# Patient Record
Sex: Female | Born: 1970 | Hispanic: Yes | Marital: Single | Smoking: Never smoker
Health system: Southern US, Community
[De-identification: ages and names within clinical notes are randomized; demographics above are authoritative.]

## PROBLEM LIST (undated history)

## (undated) HISTORY — PX: EYE SURGERY: SHX253

---

## 2017-04-19 ENCOUNTER — Ambulatory Visit: Payer: Self-pay | Admitting: Medical

## 2017-04-27 ENCOUNTER — Ambulatory Visit: Payer: Self-pay | Admitting: Medical

## 2018-01-13 ENCOUNTER — Ambulatory Visit: Payer: Self-pay | Admitting: Medical

## 2018-01-13 ENCOUNTER — Encounter: Payer: Self-pay | Admitting: Medical

## 2018-01-13 ENCOUNTER — Ambulatory Visit (INDEPENDENT_AMBULATORY_CARE_PROVIDER_SITE_OTHER): Payer: BLUE CROSS/BLUE SHIELD | Admitting: Medical

## 2018-01-13 VITALS — BP 114/63 | HR 68 | Temp 98.6°F | Resp 16 | Ht 63.5 in | Wt 169.6 lb

## 2018-01-13 DIAGNOSIS — R5383 Other fatigue: Secondary | ICD-10-CM | POA: Diagnosis not present

## 2018-01-13 DIAGNOSIS — Z113 Encounter for screening for infections with a predominantly sexual mode of transmission: Secondary | ICD-10-CM

## 2018-01-13 DIAGNOSIS — Z3009 Encounter for other general counseling and advice on contraception: Secondary | ICD-10-CM

## 2018-01-13 DIAGNOSIS — N926 Irregular menstruation, unspecified: Secondary | ICD-10-CM | POA: Diagnosis not present

## 2018-01-13 DIAGNOSIS — Z124 Encounter for screening for malignant neoplasm of cervix: Secondary | ICD-10-CM

## 2018-01-13 DIAGNOSIS — Z1239 Encounter for other screening for malignant neoplasm of breast: Secondary | ICD-10-CM

## 2018-01-13 DIAGNOSIS — R1013 Epigastric pain: Secondary | ICD-10-CM

## 2018-01-13 DIAGNOSIS — R634 Abnormal weight loss: Secondary | ICD-10-CM | POA: Diagnosis not present

## 2018-01-13 DIAGNOSIS — R413 Other amnesia: Secondary | ICD-10-CM

## 2018-01-13 LAB — POCT URINE PREGNANCY: Preg Test, Ur: NEGATIVE

## 2018-01-13 MED ORDER — OMEPRAZOLE 20 MG PO CPDR: 20.0000 mg | DELAYED_RELEASE_CAPSULE | Freq: Every day | ORAL | 3 refills | Status: DC

## 2018-01-13 MED ORDER — ONDANSETRON 8 MG PO TBDP: 8.0000 mg | ORAL_TABLET | Freq: Three times a day (TID) | ORAL | 0 refills | Status: DC | PRN

## 2018-01-13 NOTE — Progress Notes (Signed)
Subjective:    Patient ID: Virginia Allen, female    DOB: 03/08/1971, 47 y.o.   MRN: [id number]  HPI  Pt in for first time.  She has been in with her son in past.  She is Runner, broadcasting/film/video (spanish).  Pt does work out dancing at time during the week. Nonsmoker. Occasional alcohol only on weekend twice a month or wine with dinner. She is eating healthy. Married- one child.  Pt declines flu vaccine.  Pt does not take any medication.   Pt states has some recent stomach pain. States Sunday when she ate she was feeling immediate abdomen pain and nausea. Then about one week ago had similar events. She states 2 events recently that had quick onset of pain after eating. States since Tuesday until today eating chicken soup. No vomiting. No diarrhea or constipation. No blood in stool. Denies any history of heart burn in the past.   She does not since summer has lost some weight. Last was about 20 lbs in past 3 and half months.  In the summer she was not having stomach.   LMP- last menses 20 days ago.   Just recent relationship. Pt did take morning after pill. She states may have effected.   Pt never had papsmear. That she can remember.   Review of Systems  Constitutional: Positive for fatigue and unexpected weight change.       She states last 2 weeks feels tired.  HENT: Positive for congestion. Negative for hearing loss, nosebleeds, postnasal drip and rhinorrhea.        Some tinnitus last 2 days.   Respiratory: Negative for cough, chest tightness, shortness of breath and wheezing.   Cardiovascular: Negative for chest pain and palpitations.  Gastrointestinal: Positive for abdominal pain. Negative for abdominal distention, blood in stool, constipation, diarrhea, nausea and vomiting.  Endocrine: Positive for polydipsia. Negative for polyphagia and polyuria.  Genitourinary: Negative for difficulty urinating, dysuria, frequency, pelvic pain, urgency, vaginal bleeding and vaginal pain.    Musculoskeletal: Negative for back pain and myalgias.  Skin: Negative for rash.       Hair loss  Neurological: Negative for dizziness, weakness and headaches.  Hematological: Negative for adenopathy. Does not bruise/bleed easily.  Psychiatric/Behavioral: Positive for sleep disturbance. Negative for agitation, confusion and dysphoric mood. The patient is not nervous/anxious.        Just recently not sleeping well.   Recent mild forgetfull and decreased concentration.  Some recent more stress.    No past medical history on file.   Social History   Socioeconomic History  . Marital status: Single    Spouse name: Not on file  . Number of children: Not on file  . Years of education: Not on file  . Highest education level: Not on file  Occupational History  . Not on file  Social Needs  . Financial resource strain: Not on file  . Food insecurity:    Worry: Not on file    Inability: Not on file  . Transportation needs:    Medical: Not on file    Non-medical: Not on file  Tobacco Use  . Smoking status: Never Smoker  . Smokeless tobacco: Never Used  Substance and Sexual Activity  . Alcohol use: Never    Frequency: Never  . Drug use: Never  . Sexual activity: Not on file  Lifestyle  . Physical activity:    Days per week: Not on file    Minutes per session: Not on file  .  Stress: Not on file  Relationships  . Social connections:    Talks on phone: Not on file    Gets together: Not on file    Attends religious service: Not on file    Active member of club or organization: Not on file    Attends meetings of clubs or organizations: Not on file    Relationship status: Not on file  . Intimate partner violence:    Fear of current or ex partner: Not on file    Emotionally abused: Not on file    Physically abused: Not on file    Forced sexual activity: Not on file  Other Topics Concern  . Not on file  Social History Narrative  . Not on file     No family history on  file.  Not on File  No current outpatient medications on file prior to visit.   No current facility-administered medications on file prior to visit.     BP 114/63   Pulse 68   Temp 98.6 F (37 C) (Oral)   Resp 16   Ht 5' 3.5" (1.613 m)   Wt 169 lb 9.6 oz (76.9 kg)   SpO2 99%   BMI 29.57 kg/m       Objective:   Physical Exam  General Mental Status- Alert. General Appearance- Not in acute distress.   Skin General: Color- Normal Color. Moisture- Normal Moisture.  Neck Carotid Arteries- Normal color. Moisture- Normal Moisture. No carotid bruits. No JVD.  Chest and Lung Exam Auscultation: Breath Sounds:-Normal.  Cardiovascular Auscultation:Rythm- Regular. Murmurs & Other Heart Sounds:Auscultation of the heart reveals- No Murmurs.  Abdomen Inspection:-Inspeection Normal. Palpation/Percussion:Note:No mass. Palpation and Percussion of the abdomen reveal- Non Tender, Non Distended + BS, no rebound or guarding.   Neurologic Cranial Nerve exam:- CN III-XII intact(No nystagmus), symmetric smile. Finger to Nose:- Normal/Intact Strength:- 5/5 equal and symmetric strength both upper and lower extremities.      Assessment & Plan:  For your recent history of fatigue, epigastric pain, weight loss and nausea, I ordered the below listed labs.  While he wait for lab results, I do want you to eat healthy diet and prescribed omeprazole for potential GERD and Zofran for nausea and vomiting.  For recent irregular menses and unprotected sex, we will order pregnancy test and HIV screen.  I went ahead and placed gynecologic referral so we can get screening Pap smear and discuss family planning/contraceptive options.  During the interim recommend use condoms.  You have intermittent episodes of decreased concentration and memory complaints.  Will follow your labs but if everything looks normal then we will do baseline Mini-Mental status test.  If that is normal but symptomatically  you feel that memory issues are worsening then would refer to neurologist.  Regarding your weight loss complaints, will follow this closely and make sure Pap smear up-to-date and order screening mammogram.  If still comes back positive for blood then would refer to GI for possible colonoscopy.  Might even consider EGD if your stomach pain complaints persist.  CT abdomen pelvis might be option as well.  Follow-up in 10 to 14 days or as needed.  45 minutes spent with pt today. Counseled 50% regarding differntial diagnosis and work up for [employer] complaints.

## 2018-01-13 NOTE — Patient Instructions (Addendum)
For your recent history of fatigue, epigastric pain, weight loss and nausea, I ordered the below listed labs.  While he wait for lab results, I do want you to eat healthy diet and prescribed omeprazole for potential GERD and Zofran for nausea and vomiting.  For recent irregular menses and unprotected sex, we will order pregnancy test and HIV screen.  I went ahead and placed gynecologic referral so we can get screening Pap smear and discuss family planning/contraceptive options.  During the interim recommend use condoms.  You have intermittent episodes of decreased concentration and memory complaints.  Will follow your labs but if everything looks normal then we will do baseline Mini-Mental status test.  If that is normal but symptomatically you feel that memory issues are worsening then would refer to neurologist.  Regarding your weight loss complaints, will follow this closely and make sure Pap smear up-to-date and order screening mammogram.  If still comes back positive for blood then would refer to GI for possible colonoscopy.  Might even consider EGD if your stomach pain complaints persist.  CT abdomen pelvis might be option as well.   Esperanza Richters, PA-C Follow-up in 10 to 14 days or as needed.

## 2018-01-14 LAB — CBC WITH DIFFERENTIAL/PLATELET
BASOS PCT: 0.6 %
Basophils Absolute: 56 cells/uL (ref 0–200)
EOS PCT: 1.1 %
Eosinophils Absolute: 102 cells/uL (ref 15–500)
HCT: 37.5 % (ref 35.0–45.0)
Hemoglobin: 12.3 g/dL (ref 11.7–15.5)
Lymphs Abs: 2585 cells/uL (ref 850–3900)
MCH: 27.7 pg (ref 27.0–33.0)
MCHC: 32.8 g/dL (ref 32.0–36.0)
MCV: 84.5 fL (ref 80.0–100.0)
MONOS PCT: 6.6 %
MPV: 11.5 fL (ref 7.5–12.5)
NEUTROS PCT: 63.9 %
Neutro Abs: 5943 cells/uL (ref 1500–7800)
PLATELETS: 325 10*3/uL (ref 140–400)
RBC: 4.44 10*6/uL (ref 3.80–5.10)
RDW: 12.7 % (ref 11.0–15.0)
TOTAL LYMPHOCYTE: 27.8 %
WBC mixed population: 614 cells/uL (ref 200–950)
WBC: 9.3 10*3/uL (ref 3.8–10.8)

## 2018-01-14 LAB — T4, FREE: FREE T4: 1.1 ng/dL (ref 0.8–1.8)

## 2018-01-14 LAB — VITAMIN D 25 HYDROXY (VIT D DEFICIENCY, FRACTURES): Vit D, 25-Hydroxy: 35 ng/mL (ref 30–100)

## 2018-01-14 LAB — COMPREHENSIVE METABOLIC PANEL
AG Ratio: 1.4 (calc) (ref 1.0–2.5)
ALKALINE PHOSPHATASE (APISO): 77 U/L (ref 33–115)
ALT: 10 U/L (ref 6–29)
AST: 12 U/L (ref 10–35)
Albumin: 4 g/dL (ref 3.6–5.1)
BUN: 9 mg/dL (ref 7–25)
CHLORIDE: 105 mmol/L (ref 98–110)
CO2: 24 mmol/L (ref 20–32)
CREATININE: 0.72 mg/dL (ref 0.50–1.10)
Calcium: 8.5 mg/dL — ABNORMAL LOW (ref 8.6–10.2)
GLUCOSE: 76 mg/dL (ref 65–99)
Globulin: 2.8 g/dL (calc) (ref 1.9–3.7)
Potassium: 3.9 mmol/L (ref 3.5–5.3)
Sodium: 140 mmol/L (ref 135–146)
Total Bilirubin: 0.3 mg/dL (ref 0.2–1.2)
Total Protein: 6.8 g/dL (ref 6.1–8.1)

## 2018-01-14 LAB — TSH: TSH: 1.63 m[IU]/L

## 2018-01-14 LAB — IRON: IRON: 45 ug/dL (ref 40–190)

## 2018-01-14 LAB — AMYLASE: AMYLASE: 32 U/L (ref 21–101)

## 2018-01-14 LAB — VITAMIN B12: Vitamin B-12: 466 pg/mL (ref 200–1100)

## 2018-01-14 LAB — LIPASE: LIPASE: 16 U/L (ref 7–60)

## 2018-01-14 LAB — HIV ANTIBODY (ROUTINE TESTING W REFLEX): HIV: NONREACTIVE

## 2018-01-16 ENCOUNTER — Telehealth: Payer: Self-pay | Admitting: Medical

## 2018-01-16 LAB — H. PYLORI BREATH TEST: H. pylori Breath Test: DETECTED — AB

## 2018-01-16 MED ORDER — CLARITHROMYCIN ER 500 MG PO TB24: 1000.0000 mg | ORAL_TABLET | Freq: Every day | ORAL | 0 refills | Status: DC

## 2018-01-16 MED ORDER — AMOXICILLIN 500 MG PO TABS: ORAL_TABLET | ORAL | 0 refills | Status: DC

## 2018-01-16 NOTE — Telephone Encounter (Signed)
rx antibiotic sent to pt pharmacy.

## 2018-01-17 ENCOUNTER — Inpatient Hospital Stay (HOSPITAL_BASED_OUTPATIENT_CLINIC_OR_DEPARTMENT_OTHER): Admission: RE | Admit: 2018-01-17 | Payer: BLUE CROSS/BLUE SHIELD | Source: Ambulatory Visit

## 2018-01-17 ENCOUNTER — Ambulatory Visit (HOSPITAL_BASED_OUTPATIENT_CLINIC_OR_DEPARTMENT_OTHER): Payer: BLUE CROSS/BLUE SHIELD

## 2018-01-17 LAB — VITAMIN B1: Vitamin B1 (Thiamine): <6 nmol/L — ABNORMAL LOW (ref 8–30)

## 2018-01-18 ENCOUNTER — Ambulatory Visit (HOSPITAL_BASED_OUTPATIENT_CLINIC_OR_DEPARTMENT_OTHER)
Admission: RE | Admit: 2018-01-18 | Discharge: 2018-01-18 | Disposition: A | Discharge status: Home / Self Care | Payer: BLUE CROSS/BLUE SHIELD | Source: Ambulatory Visit | Attending: Medical | Admitting: Medical

## 2018-01-18 DIAGNOSIS — Z1239 Encounter for other screening for malignant neoplasm of breast: Secondary | ICD-10-CM | POA: Insufficient documentation

## 2018-01-18 DIAGNOSIS — Z1231 Encounter for screening mammogram for malignant neoplasm of breast: Secondary | ICD-10-CM | POA: Diagnosis not present

## 2018-02-02 ENCOUNTER — Encounter: Payer: Self-pay | Admitting: Family Medicine

## 2018-02-02 ENCOUNTER — Ambulatory Visit (INDEPENDENT_AMBULATORY_CARE_PROVIDER_SITE_OTHER): Payer: BLUE CROSS/BLUE SHIELD | Admitting: Family Medicine

## 2018-02-02 VITALS — BP 110/55 | HR 73 | Ht 63.5 in | Wt 167.0 lb

## 2018-02-02 DIAGNOSIS — Z1151 Encounter for screening for human papillomavirus (HPV): Secondary | ICD-10-CM

## 2018-02-02 DIAGNOSIS — Z01419 Encounter for gynecological examination (general) (routine) without abnormal findings: Secondary | ICD-10-CM

## 2018-02-02 DIAGNOSIS — Z113 Encounter for screening for infections with a predominantly sexual mode of transmission: Secondary | ICD-10-CM

## 2018-02-02 DIAGNOSIS — Z124 Encounter for screening for malignant neoplasm of cervix: Secondary | ICD-10-CM

## 2018-02-02 NOTE — Progress Notes (Signed)
GYNECOLOGY ANNUAL PREVENTATIVE CARE ENCOUNTER NOTE  Subjective:   Virginia Allen is a 48 y.o. G59P1001 female here for a routine annual gynecologic exam.  Current complaints: weight loss, unprotected sex.   Denies abnormal vaginal bleeding, discharge, pelvic pain, problems with intercourse or other gynecologic concerns.    Gynecologic History No LMP recorded. Patient is sexually active  Contraception: none Last Pap: uncertain. Mammogram: 12/2017. Normal.  Obstetric History OB History  Gravida Para Term Preterm AB Living  1 1 1     1   SAB TAB Ectopic Multiple Live Births          1    # Outcome Date GA Lbr Len/2nd Weight Sex Delivery Anes PTL Lv  1 Term 2002 [redacted]w[redacted]d   M Vag-Spont EPI  LIV    No past medical history on file.  No past surgical history on file.  Current Outpatient Medications on File Prior to Visit  Medication Sig Dispense Refill  . amoxicillin (AMOXIL) 500 MG tablet 2 tab po bid x 10 days 40 tablet 0  . clarithromycin (BIAXIN XL) 500 MG 24 hr tablet Take 2 tablets (1,000 mg total) by mouth daily. 20 tablet 0  . omeprazole (PRILOSEC) 20 MG capsule Take 1 capsule (20 mg total) by mouth daily. 30 capsule 3  . ondansetron (ZOFRAN ODT) 8 MG disintegrating tablet Take 1 tablet (8 mg total) by mouth every 8 (eight) hours as needed for nausea or vomiting. (Patient not taking: Reported on 02/02/2018) 20 tablet 0   No current facility-administered medications on file prior to visit.     No Known Allergies  Social History   Socioeconomic History  . Marital status: Single    Spouse name: Not on file  . Number of children: Not on file  . Years of education: Not on file  . Highest education level: Not on file  Occupational History  . Not on file  Social Needs  . Financial resource strain: Not on file  . Food insecurity:    Worry: Not on file    Inability: Not on file  . Transportation needs:    Medical: Not on file    Non-medical: Not on file    Tobacco Use  . Smoking status: Never Smoker  . Smokeless tobacco: Never Used  Substance and Sexual Activity  . Alcohol use: Never    Frequency: Never    Comment: rare  . Drug use: Never  . Sexual activity: Yes    Birth control/protection: None  Lifestyle  . Physical activity:    Days per week: Not on file    Minutes per session: Not on file  . Stress: Not on file  Relationships  . Social connections:    Talks on phone: Not on file    Gets together: Not on file    Attends religious service: Not on file    Active member of club or organization: Not on file    Attends meetings of clubs or organizations: Not on file    Relationship status: Not on file  . Intimate partner violence:    Fear of current or ex partner: Not on file    Emotionally abused: Not on file    Physically abused: Not on file    Forced sexual activity: Not on file  Other Topics Concern  . Not on file  Social History Narrative  . Not on file    Family History  Problem Relation Age of Onset  . Hypertension Father   .  Cancer Father   . Hypertension Mother     The following portions of the patient's history were reviewed and updated as appropriate: allergies, current medications, past family history, past medical history, past social history, past surgical history and problem list.  Review of Systems Pertinent items are noted in HPI.   Objective:  BP (!) 110/55   Pulse 73   Ht 5' 3.5" (1.613 m)   Wt 167 lb 0.6 oz (75.8 kg)   BMI 29.13 kg/m  CONSTITUTIONAL: Well-developed, well-nourished female in no acute distress.  HENT:  Normocephalic, atraumatic, External right and left ear normal. Oropharynx is clear and moist EYES: Conjunctivae and EOM are normal. Pupils are equal, round, and reactive to light. No scleral icterus.  NECK: Normal range of motion, supple, no masses.  Normal thyroid.   CARDIOVASCULAR: Normal heart rate noted, regular rhythm RESPIRATORY: Clear to auscultation bilaterally. Effort  and breath sounds normal, no problems with respiration noted. BREASTS: Symmetric in size. No masses, skin changes, nipple drainage, or lymphadenopathy. ABDOMEN: Soft, normal bowel sounds, no distention noted.  No tenderness, rebound or guarding.  PELVIC: Normal appearing external genitalia; normal appearing vaginal mucosa and cervix.  No abnormal discharge noted.  Normal uterine size, no other palpable masses, no uterine or adnexal tenderness. MUSCULOSKELETAL: Normal range of motion. No tenderness.  No cyanosis, clubbing, or edema.  2+ distal pulses. SKIN: Skin is warm and dry. No rash noted. Not diaphoretic. No erythema. No pallor. NEUROLOGIC: Alert and oriented to person, place, and time. Normal reflexes, muscle tone coordination. No cranial nerve deficit noted. PSYCHIATRIC: Normal mood and affect. Normal behavior. Normal judgment and thought content.  Assessment:  Annual gynecologic examination with pap smear   Plan:  1. Well Woman Exam Will follow up results of pap smear and manage accordingly. Mammogram scheduled STD testing discussed. Patient requested vaginal testing - Cytology - PAP  Discussed IUD vs Nexplanon vs oral medication.  Routine preventative health maintenance measures emphasized. Please refer to After Visit Summary for other counseling recommendations.    Candelaria CelesteJacob Latrell Reitan, DO [facility]

## 2018-02-02 NOTE — Progress Notes (Signed)
[employer] interpreter Daysha [id number] 

## 2018-02-07 LAB — CYTOLOGY - PAP
Chlamydia: NEGATIVE
Diagnosis: NEGATIVE
HPV: NOT DETECTED
Neisseria Gonorrhea: NEGATIVE

## 2018-02-08 ENCOUNTER — Ambulatory Visit: Payer: BLUE CROSS/BLUE SHIELD | Admitting: Medical

## 2018-02-08 ENCOUNTER — Encounter: Payer: Self-pay | Admitting: Medical

## 2018-02-08 VITALS — BP 105/56 | HR 71 | Temp 98.1°F | Resp 16 | Ht 63.5 in | Wt 169.2 lb

## 2018-02-08 DIAGNOSIS — R7989 Other specified abnormal findings of blood chemistry: Secondary | ICD-10-CM | POA: Diagnosis not present

## 2018-02-08 DIAGNOSIS — F329 Major depressive disorder, single episode, unspecified: Secondary | ICD-10-CM | POA: Diagnosis not present

## 2018-02-08 DIAGNOSIS — F32A Depression, unspecified: Secondary | ICD-10-CM

## 2018-02-08 DIAGNOSIS — A048 Other specified bacterial intestinal infections: Secondary | ICD-10-CM

## 2018-02-08 DIAGNOSIS — R5383 Other fatigue: Secondary | ICD-10-CM | POA: Diagnosis not present

## 2018-02-08 MED ORDER — VENLAFAXINE HCL ER 37.5 MG PO CP24: 37.5000 mg | ORAL_CAPSULE | Freq: Every day | ORAL | 0 refills | Status: DC

## 2018-02-08 NOTE — Progress Notes (Signed)
Subjective:    Patient ID: Virginia Allen, female    DOB: Aug 14, 1970, 47 y.o.   MRN: [id number]  HPI   Pt in for follow up.  Pt has had various symptoms in recent  Past. See last visit note.  Pt has had fatigue in past with work up. B1 vitamin level was low and calcium was low. Other labs negative except h pylori was +.  Pt states recent new  relationship ended. Pt also just found out her dad has bladder cancer. Some spreading to lungs of dad. Pt just found out 5 days ago. Pt states no hx of depression except in the beginning of her pregnancy. She describes feeling depressed and willing to use medication.   Pt weight has stablized. Pt max weight in past was 176 lb. Since summer mild weight loss but now weight loss stablized. Her tsh was normal. Amylase and  lipase normal. Pt papsmear was negative one week ago. Result was negative. She may get iud tomorrow.   Pt had h pylori was treated. She took 2 antibiotics. Also on omeprazole. Still has some pain. Brief bloated sensation today.    Review of Systems  Constitutional: Positive for fatigue. Negative for chills.  Respiratory: Negative for cough, chest tightness, shortness of breath and wheezing.   Cardiovascular: Negative for chest pain and palpitations.  Gastrointestinal: Positive for abdominal pain. Negative for abdominal distention, constipation, diarrhea, nausea and vomiting.       Full easily and bloated.  Genitourinary: Negative for difficulty urinating, dyspareunia and dysuria.  Musculoskeletal: Negative for back pain and gait problem.  Skin: Negative for rash.  Neurological: Negative for dizziness, speech difficulty, weakness and headaches.  Hematological: Negative for adenopathy. Does not bruise/bleed easily.  Psychiatric/Behavioral: Positive for dysphoric mood. Negative for behavioral problems, confusion, sleep disturbance and suicidal ideas. The patient is nervous/anxious.     No past medical history on file.     Social History   Socioeconomic History  . Marital status: Single    Spouse name: Not on file  . Number of children: Not on file  . Years of education: Not on file  . Highest education level: Not on file  Occupational History  . Not on file  Social Needs  . Financial resource strain: Not on file  . Food insecurity:    Worry: Not on file    Inability: Not on file  . Transportation needs:    Medical: Not on file    Non-medical: Not on file  Tobacco Use  . Smoking status: Never Smoker  . Smokeless tobacco: Never Used  Substance and Sexual Activity  . Alcohol use: Never    Frequency: Never    Comment: rare  . Drug use: Never  . Sexual activity: Yes    Birth control/protection: None  Lifestyle  . Physical activity:    Days per week: Not on file    Minutes per session: Not on file  . Stress: Not on file  Relationships  . Social connections:    Talks on phone: Not on file    Gets together: Not on file    Attends religious service: Not on file    Active member of club or organization: Not on file    Attends meetings of clubs or organizations: Not on file    Relationship status: Not on file  . Intimate partner violence:    Fear of current or ex partner: Not on file    Emotionally abused: Not on file  Physically abused: Not on file    Forced sexual activity: Not on file  Other Topics Concern  . Not on file  Social History Narrative  . Not on file    No past surgical history on file.  Family History  Problem Relation Age of Onset  . Hypertension Father   . Cancer Father   . Hypertension Mother     No Known Allergies  Current Outpatient Medications on File Prior to Visit  Medication Sig Dispense Refill  . clarithromycin (BIAXIN XL) 500 MG 24 hr tablet Take 2 tablets (1,000 mg total) by mouth daily. 20 tablet 0  . omeprazole (PRILOSEC) 20 MG capsule Take 1 capsule (20 mg total) by mouth daily. 30 capsule 3  . ondansetron (ZOFRAN ODT) 8 MG disintegrating tablet  Take 1 tablet (8 mg total) by mouth every 8 (eight) hours as needed for nausea or vomiting. 20 tablet 0   No current facility-administered medications on file prior to visit.     BP (!) 105/56   Pulse 71   Temp 98.1 F (36.7 C) (Oral)   Resp 16   Ht 5' 3.5" (1.613 m)   Wt 169 lb 3.2 oz (76.7 kg)   SpO2 100%   BMI 29.50 kg/m       Objective:   Physical Exam  General Mental Status- Alert. General Appearance- Not in acute distress.   Skin General: Color- Normal Color. Moisture- Normal Moisture.  Neck Carotid Arteries- Normal color. Moisture- Normal Moisture. No carotid bruits. No JVD.  Chest and Lung Exam Auscultation: Breath Sounds:-Normal.  Cardiovascular Auscultation:Rythm- Regular. Murmurs & Other Heart Sounds:Auscultation of the heart reveals- No Murmurs.  Abdomen Inspection:-Inspeection Normal. Palpation/Percussion:Note:No mass. Palpation and Percussion of the abdomen reveal- Non Tender, Non Distended + BS, no rebound or guarding.    Neurologic Cranial Nerve exam:- CN III-XII intact(No nystagmus), symmetric smile. Strength:- 5/5 equal and symmetric strength both upper and lower extremities.      Assessment & Plan:  Your fatigue work-up was negative except your B1 vitamin was low.  This might be a factor in your fatigue.  Please get B1 vitamin over-the-counter and take daily.  You did have some mildly low calcium on last labs and will repeat metabolic panel and add PTH level.  In addition you still have some abdominal discomfort and this causes concern that she might have resistant Helicobacter pylori despite being treated with antibiotics.  Will get H. pylori breath test tomorrow.  The fact that you have been on omeprazole might affect the accuracy of the test but if it is positive that will indicate persisting infection.  You might need referral to GI MD in the near future.  Your weight loss has been stabilized recently.  Initial work-up for weight loss  was negative.  As long as your weight is stable I do not think you need expansive work-up at this point.  For decreased mood recently related to various life circumstances including your dad's diagnosis of cancer, I will prescribe venlafaxine.  This might help with your energy as well.  Follow-up in 2 weeks or as needed.  Esperanza RichtersEdward Nickolus Wadding, PA-C

## 2018-02-08 NOTE — Patient Instructions (Signed)
Your fatigue work-up was negative except your B1 vitamin was low.  This might be a factor in your fatigue.  Please get B1 vitamin over-the-counter and take daily.  You did have some mildly low calcium on last labs and will repeat metabolic panel and add PTH level.  In addition you still have some abdominal discomfort and this causes concern that she might have resistant Helicobacter pylori despite being treated with antibiotics.  Will get H. pylori breath test tomorrow.  The fact that you have been on omeprazole might affect the accuracy of the test but if it is positive that will indicate persisting infection.  You might need referral to GI MD in the near future.  Your weight loss has been stabilized recently.  Initial work-up for weight loss was negative.  As long as your weight is stable I do not think you need expansive work-up at this point.  For decreased mood recently related to various life circumstances including your dad's diagnosis of cancer, I will prescribe venlafaxine.  This might help with your energy as well.  Follow-up in 2 weeks or as needed.

## 2018-02-09 ENCOUNTER — Other Ambulatory Visit (INDEPENDENT_AMBULATORY_CARE_PROVIDER_SITE_OTHER): Payer: BLUE CROSS/BLUE SHIELD

## 2018-02-09 ENCOUNTER — Ambulatory Visit (INDEPENDENT_AMBULATORY_CARE_PROVIDER_SITE_OTHER): Payer: BLUE CROSS/BLUE SHIELD | Admitting: Family Medicine

## 2018-02-09 VITALS — BP 118/79 | HR 75 | Ht 63.0 in | Wt 167.0 lb

## 2018-02-09 DIAGNOSIS — R7989 Other specified abnormal findings of blood chemistry: Secondary | ICD-10-CM | POA: Diagnosis not present

## 2018-02-09 DIAGNOSIS — A048 Other specified bacterial intestinal infections: Secondary | ICD-10-CM

## 2018-02-09 DIAGNOSIS — Z30432 Encounter for removal of intrauterine contraceptive device: Secondary | ICD-10-CM | POA: Diagnosis not present

## 2018-02-09 DIAGNOSIS — R5383 Other fatigue: Secondary | ICD-10-CM | POA: Diagnosis not present

## 2018-02-09 DIAGNOSIS — Z309 Encounter for contraceptive management, unspecified: Secondary | ICD-10-CM

## 2018-02-09 MED ORDER — MISOPROSTOL 200 MCG PO TABS: ORAL_TABLET | ORAL | 0 refills | Status: DC

## 2018-02-09 NOTE — Progress Notes (Signed)
IUD Procedure Note Patient identified, informed consent performed, signed copy in chart, time out was performed.  Urine pregnancy test negative.  Speculum placed in the vagina.  Cervix visualized.  Cleaned with Betadine x 2.  Grasped anteriorly with a single tooth tenaculum. Attempted to place IUD, but internal os stenotic. Attempted to dilate internal os, but unable to. Procedure stopped.   Will prescribe patient cytotec and have her place the cytotec vaginally night prior to next appointment.

## 2018-02-10 LAB — COMPREHENSIVE METABOLIC PANEL
ALT: 9 U/L (ref 0–35)
AST: 9 U/L (ref 0–37)
Albumin: 4 g/dL (ref 3.5–5.2)
Alkaline Phosphatase: 87 U/L (ref 39–117)
BUN: 15 mg/dL (ref 6–23)
CHLORIDE: 104 meq/L (ref 96–112)
CO2: 30 meq/L (ref 19–32)
CREATININE: 0.83 mg/dL (ref 0.40–1.20)
Calcium: 8.8 mg/dL (ref 8.4–10.5)
GFR: 78.07 mL/min (ref >60.00)
Glucose, Bld: 90 mg/dL (ref 70–99)
POTASSIUM: 4.3 meq/L (ref 3.5–5.1)
Sodium: 141 mEq/L (ref 135–145)
Total Bilirubin: 0.3 mg/dL (ref 0.2–1.2)
Total Protein: 6.9 g/dL (ref 6.0–8.3)

## 2018-02-10 LAB — H. PYLORI BREATH TEST: H. pylori Breath Test: NOT DETECTED

## 2018-02-10 LAB — PARATHYROID HORMONE, INTACT (NO CA): PTH: 69 pg/mL — ABNORMAL HIGH (ref 14–64)

## 2018-02-12 ENCOUNTER — Telehealth: Payer: Self-pay | Admitting: Medical

## 2018-02-12 DIAGNOSIS — R7989 Other specified abnormal findings of blood chemistry: Secondary | ICD-10-CM

## 2018-02-12 NOTE — Telephone Encounter (Signed)
Referral to endocrinologist placed. 

## 2018-02-22 ENCOUNTER — Ambulatory Visit: Payer: BLUE CROSS/BLUE SHIELD | Admitting: Medical

## 2018-02-23 ENCOUNTER — Encounter: Payer: Self-pay | Admitting: Family Medicine

## 2018-02-23 ENCOUNTER — Encounter: Payer: Self-pay | Admitting: Medical

## 2018-02-23 ENCOUNTER — Ambulatory Visit: Payer: BLUE CROSS/BLUE SHIELD | Admitting: Medical

## 2018-02-23 ENCOUNTER — Ambulatory Visit (INDEPENDENT_AMBULATORY_CARE_PROVIDER_SITE_OTHER): Payer: BLUE CROSS/BLUE SHIELD | Admitting: Family Medicine

## 2018-02-23 VITALS — BP 119/60 | HR 66 | Ht 63.0 in | Wt 170.0 lb

## 2018-02-23 DIAGNOSIS — Z30430 Encounter for insertion of intrauterine contraceptive device: Secondary | ICD-10-CM | POA: Diagnosis not present

## 2018-02-23 LAB — POCT URINE PREGNANCY: PREG TEST UR: NEGATIVE

## 2018-02-23 MED ORDER — LEVONORGESTREL 19.5 MCG/DAY IU IUD
INTRAUTERINE_SYSTEM | Freq: Once | INTRAUTERINE | Status: AC
  Administered 2018-02-23: 1 via INTRAUTERINE

## 2018-02-23 NOTE — Patient Instructions (Signed)

## 2018-02-23 NOTE — Progress Notes (Signed)
IUD Procedure Note Patient identified, informed consent performed, signed copy in chart, time out was performed.  Urine pregnancy test negative.  Speculum placed in the vagina.  Cervix visualized.  Cleaned with Betadine x 2.  Grasped anteriorly with a single tooth tenaculum.  Uterus sounded to 10 cm.  Liletta  IUD placed per manufacturer's recommendations.  Strings trimmed to 3 cm. Tenaculum was removed, good hemostasis noted.  Patient tolerated procedure well.   Patient given post procedure instructions and Liletta care card with expiration date.  Patient is asked to check IUD strings periodically and follow up in 4-6 weeks for IUD check.

## 2018-02-24 NOTE — Patient Instructions (Signed)
Pt left and rescheduld for Monday. Had gyn procedure conflict with her appointment today.

## 2018-02-27 ENCOUNTER — Encounter: Payer: Self-pay | Admitting: Medical

## 2018-02-27 ENCOUNTER — Ambulatory Visit (INDEPENDENT_AMBULATORY_CARE_PROVIDER_SITE_OTHER): Payer: BLUE CROSS/BLUE SHIELD | Admitting: Medical

## 2018-02-27 VITALS — BP 113/63 | HR 65 | Temp 98.6°F | Resp 16 | Ht 63.0 in | Wt 167.2 lb

## 2018-02-27 DIAGNOSIS — F329 Major depressive disorder, single episode, unspecified: Secondary | ICD-10-CM | POA: Diagnosis not present

## 2018-02-27 DIAGNOSIS — R5383 Other fatigue: Secondary | ICD-10-CM | POA: Diagnosis not present

## 2018-02-27 DIAGNOSIS — R7989 Other specified abnormal findings of blood chemistry: Secondary | ICD-10-CM

## 2018-02-27 DIAGNOSIS — E519 Thiamine deficiency, unspecified: Secondary | ICD-10-CM | POA: Diagnosis not present

## 2018-02-27 DIAGNOSIS — F32A Depression, unspecified: Secondary | ICD-10-CM

## 2018-02-27 MED ORDER — VENLAFAXINE HCL ER 37.5 MG PO CP24: 37.5000 mg | ORAL_CAPSULE | Freq: Every day | ORAL | 3 refills | Status: DC

## 2018-02-27 NOTE — Patient Instructions (Addendum)
Your depression and anxiety seem to improve when you were briefly on low dose of Effexor.  Sometimes it can take up to 2 weeks to take full effect.  So please restart Effexor and give us an update in 2 to 3 weeks if you feel like you need higher dose.  I went ahead and sent in prescription of 37.5 mg with 2 months refills.  I think your fatigue will be improved with Effexor as well.  It has neurotransmitter that can help with fatigue/decreased energy.  I do want you to go ahead and start taking the 1 vitamin daily for at least 1 month.  Also you did have slight high PTH and low calcium on previous screening labs.  I did place referral to endocrinologist and it appears they tried to call you but they were unable to get through.  [facility] to get you number of that office or you can call them back directly.  Follow-up in 3 months or as needed.  Sooner if mood or other signs and symptoms worsen.

## 2018-02-27 NOTE — Progress Notes (Signed)
Subjective:    Patient ID: Virginia Allen, female    DOB: 1970-05-29, 47 y.o.   MRN: [id number]  HPI  Pt in for follow up.  Pt in states she continue to feel dizzy and fatigue intermittent. Pt had work up and it was overall negative.  Pt she did have low b1. Also had low calcium and increased pth. I did refer pt to endocrinologist but she has not gotten call yet.  She has not been taking b1 level.   Pt has some decreased mood related to life circumstance. She gave example of listening to work program requirements and states felt dizziness and overwhelmed. Pt started effexor. She is only on first week of med and skipped Friday. Also no med yesterday or today.   When she did use the effexor she actually states she felt better with above symptoms.  LMP- over weekend. Just got iud placed.   Review of Systems  Constitutional: Positive for fatigue.  Respiratory: Negative for cough, chest tightness, shortness of breath and wheezing.   Cardiovascular: Negative for chest pain and palpitations.  Gastrointestinal: Negative for abdominal pain, blood in stool and constipation.  Musculoskeletal: Negative for back pain.  Skin: Negative for rash.  Neurological: Positive for dizziness. Negative for syncope, facial asymmetry, speech difficulty, weakness, numbness and headaches.       Rare transient dizziness often with stress.  Hematological: Negative for adenopathy. Does not bruise/bleed easily.  Psychiatric/Behavioral: Positive for dysphoric mood. Negative for behavioral problems, sleep disturbance and suicidal ideas. The patient is nervous/anxious.     No past medical history on file.   Social History   Socioeconomic History  . Marital status: Single    Spouse name: Not on file  . Number of children: Not on file  . Years of education: Not on file  . Highest education level: Not on file  Occupational History  . Not on file  Social Needs  . Financial resource strain: Not on  file  . Food insecurity:    Worry: Not on file    Inability: Not on file  . Transportation needs:    Medical: Not on file    Non-medical: Not on file  Tobacco Use  . Smoking status: Never Smoker  . Smokeless tobacco: Never Used  Substance and Sexual Activity  . Alcohol use: Never    Frequency: Never    Comment: rare  . Drug use: Never  . Sexual activity: Yes    Birth control/protection: None  Lifestyle  . Physical activity:    Days per week: Not on file    Minutes per session: Not on file  . Stress: Not on file  Relationships  . Social connections:    Talks on phone: Not on file    Gets together: Not on file    Attends religious service: Not on file    Active member of club or organization: Not on file    Attends meetings of clubs or organizations: Not on file    Relationship status: Not on file  . Intimate partner violence:    Fear of current or ex partner: Not on file    Emotionally abused: Not on file    Physically abused: Not on file    Forced sexual activity: Not on file  Other Topics Concern  . Not on file  Social History Narrative  . Not on file    No past surgical history on file.  Family History  Problem Relation Age of Onset  .  Hypertension Father   . Cancer Father   . Hypertension Mother     No Known Allergies  Current Outpatient Medications on File Prior to Visit  Medication Sig Dispense Refill  . clarithromycin (BIAXIN XL) 500 MG 24 hr tablet Take 2 tablets (1,000 mg total) by mouth daily. 20 tablet 0  . misoprostol (CYTOTEC) 200 MCG tablet Place three tablets in the vagina the night prior to your next office appointment 3 tablet 0  . omeprazole (PRILOSEC) 20 MG capsule Take 1 capsule (20 mg total) by mouth daily. 30 capsule 3  . ondansetron (ZOFRAN ODT) 8 MG disintegrating tablet Take 1 tablet (8 mg total) by mouth every 8 (eight) hours as needed for nausea or vomiting. 20 tablet 0  . venlafaxine XR (EFFEXOR XR) 37.5 MG 24 hr capsule Take 1  capsule (37.5 mg total) by mouth daily with breakfast. 30 capsule 0   No current facility-administered medications on file prior to visit.     BP 113/63   Pulse 65   Temp 98.6 F (37 C) (Oral)   Resp 16   Ht 5\' 3"  (1.6 m)   Wt 167 lb 3.2 oz (75.8 kg)   SpO2 99%   BMI 29.62 kg/m       Objective:   Physical Exam   General Mental Status- Alert. General Appearance- Not in acute distress.   Skin General: Color- Normal Color. Moisture- Normal Moisture.  Neck Carotid Arteries- Normal color. Moisture- Normal Moisture. No carotid bruits. No JVD.  Chest and Lung Exam Auscultation: Breath Sounds:-Normal.  Cardiovascular Auscultation:Rythm- Regular. Murmurs & Other Heart Sounds:Auscultation of the heart reveals- No Murmurs.  Abdomen Inspection:-Inspeection Normal. Palpation/Percussion:Note:No mass. Palpation and Percussion of the abdomen reveal- Non Tender, Non Distended + BS, no rebound or guarding.    Neurologic Cranial Nerve exam:- CN III-XII intact(No nystagmus), symmetric smile. Strength:- 5/5 equal and symmetric strength both upper and lower extremities.     Assessment & Plan:  Your depression and anxiety seem to improve when you were briefly on low dose of Effexor.  Sometimes it can take up to 2 weeks to take full effect.  So please restart Effexor and give us an update in 2 to 3 weeks if you feel like you need higher dose.  I went ahead and sent in prescription of 37.5 mg with 2 months refills.  I think your fatigue will be improved with Effexor as well.  It has neurotransmitter that can help with fatigue/decreased energy.  I do want you to go ahead and start taking the 1 vitamin daily for at least 1 month.  Also you did have slight high PTH and low calcium on previous screening labs.  I did place referral to endocrinologist and it appears they tried to call you but they were unable to get through.  [facility] to get you number of that  office or you can call them back directly.  Follow-up in 3 months or as needed.  Sooner if mood or other signs and symptoms worsen.  Esperanza RichtersEdward Stephinie Battisti, PA-C

## 2018-03-08 ENCOUNTER — Ambulatory Visit: Payer: BLUE CROSS/BLUE SHIELD | Admitting: Internal Medicine

## 2018-03-08 NOTE — Progress Notes (Deleted)
Name: Virginia Allen  MRN/ DOB: [id number], Mar 26, 1970    Age/ Sex: 47 y.o., female    PCP: Marisue BrooklynSaguier, Edward, PA-C   Reason for Endocrinology Evaluation: Elevated PTH     Date of Initial Endocrinology Evaluation: 03/08/2018     HPI: Ms. Virginia Allen is a 47 y.o. female with a past medical history of ***. The patient presented for initial endocrinology clinic visit on 03/08/2018 for consultative assistance with her ***.   Virginia Allen was noted to have low calcium level, which prompted a PTH check. Her PTH was found to be mildly evelated at   HISTORY:   Past Medical History: No past medical history on file.  Past Surgical History: No past surgical history on file.   Social History:  reports that she has never smoked. She has never used smokeless tobacco. She reports that she does not drink alcohol or use drugs.  Family History: family history includes Cancer in her father; Hypertension in her father and mother.   HOME MEDICATIONS: Current Outpatient Medications on File Prior to Visit  Medication Sig Dispense Refill  . clarithromycin (BIAXIN XL) 500 MG 24 hr tablet Take 2 tablets (1,000 mg total) by mouth daily. 20 tablet 0  . misoprostol (CYTOTEC) 200 MCG tablet Place three tablets in the vagina the night prior to your next office appointment 3 tablet 0  . omeprazole (PRILOSEC) 20 MG capsule Take 1 capsule (20 mg total) by mouth daily. 30 capsule 3  . ondansetron (ZOFRAN ODT) 8 MG disintegrating tablet Take 1 tablet (8 mg total) by mouth every 8 (eight) hours as needed for nausea or vomiting. 20 tablet 0  . venlafaxine XR (EFFEXOR XR) 37.5 MG 24 hr capsule Take 1 capsule (37.5 mg total) by mouth daily with breakfast. 30 capsule 3   No current facility-administered medications on file prior to visit.       REVIEW OF SYSTEMS: A comprehensive ROS was conducted with the patient and is negative except as per HPI and below:  Review of Systems  Constitutional:  Positive for malaise/fatigue.       OBJECTIVE:  VS: There were no vitals taken for this visit.   Wt Readings from Last 3 Encounters:  02/27/18 167 lb 3.2 oz (75.8 kg)  02/23/18 170 lb (77.1 kg)  02/23/18 170 lb 6.4 oz (77.3 kg)     EXAM: General: Pt appears well and is in NAD  Hydration: Well-hydrated with moist mucous membranes and good skin turgor  Eyes: External eye exam normal without stare, lid lag or exophthalmos.  EOM intact.  PERRL.  Ears, Nose, Throat: Hearing: Grossly intact bilaterally Dental: Good dentition  Throat: Clear without mass, erythema or exudate  Neck: General: Supple without adenopathy. Thyroid: Thyroid size normal.  No goiter or nodules appreciated. No thyroid bruit.  Lungs: Clear with good BS bilat with no rales, rhonchi, or wheezes  Heart: Auscultation: RRR.  Abdomen: Normoactive bowel sounds, soft, nontender, without masses or organomegaly palpable  Extremities: Gait and station: Normal gait  Digits and nails: No clubbing, cyanosis, petechiae, or nodes Head and neck: Normal alignment and mobility BL UE: Normal ROM and strength. BL LE: No pretibial edema normal ROM and strength.  Skin: Hair: Texture and amount normal with gender appropriate distribution Skin Inspection: No rashes, acanthosis nigricans/skin tags. No lipohypertrophy Skin Palpation: Skin temperature, texture, and thickness normal to palpation  Neuro: Cranial nerves: II - XII grossly intact  Cerebellar: Normal coordination and movement; no tremor Motor:  Normal strength throughout DTRs: 2+ and symmetric in UE without delay in relaxation phase  Mental Status: Judgment, insight: Intact Orientation: Oriented to time, place, and person Memory: Intact for recent and remote events Mood and affect: No depression, anxiety, or agitation     DATA REVIEWED:  Results for RODNEISHA, BONNET (MRN [id number]) as of 03/08/2018 15:16  Ref. Range 02/09/2018 16:17  PTH, Intact Latest Ref Range:  14 - 64 pg/mL 69 (H)     ASSESSMENT/PLAN/RECOMMENDATIONS:   1. ***    Medications :  Signed electronically by: Lyndle Herrlich, MD  [facility] Endocrinology  [facility] [address], [address] [place], [place] [address] Phone: [phone] FAX: [phone]   CC: Marisue Brooklyn [address] [facility] [address] [place] [address] Phone: [phone] Fax: [phone]   Return to Endocrinology clinic as below: Future Appointments  Date Time Provider Department Center  03/08/2018  3:40 PM Shamleffer, Konrad Dolores, MD [facility] None  04/06/2018  3:30 PM Levie Heritage, DO [facility] None

## 2018-03-28 ENCOUNTER — Ambulatory Visit (INDEPENDENT_AMBULATORY_CARE_PROVIDER_SITE_OTHER): Payer: BLUE CROSS/BLUE SHIELD | Admitting: Internal Medicine

## 2018-03-28 ENCOUNTER — Encounter: Payer: Self-pay | Admitting: Internal Medicine

## 2018-03-28 VITALS — BP 120/62 | HR 86 | Ht 63.0 in

## 2018-03-28 DIAGNOSIS — N2581 Secondary hyperparathyroidism of renal origin: Secondary | ICD-10-CM

## 2018-03-28 LAB — BASIC METABOLIC PANEL
BUN: 14 mg/dL (ref 6–23)
CHLORIDE: 102 meq/L (ref 96–112)
CO2: 28 mEq/L (ref 19–32)
CREATININE: 0.74 mg/dL (ref 0.40–1.20)
Calcium: 8.9 mg/dL (ref 8.4–10.5)
GFR: 89.08 mL/min (ref >60.00)
GLUCOSE: 83 mg/dL (ref 70–99)
Potassium: 4.1 mEq/L (ref 3.5–5.1)
Sodium: 137 mEq/L (ref 135–145)

## 2018-03-28 LAB — VITAMIN D 25 HYDROXY (VIT D DEFICIENCY, FRACTURES): VITD: 33.7 ng/mL (ref 30.00–100.00)

## 2018-03-28 LAB — ALBUMIN: Albumin: 4.1 g/dL (ref 3.5–5.2)

## 2018-03-28 NOTE — Progress Notes (Signed)
Name: Virginia Allen  MRN/ DOB: [id number], 04/22/1970    Age/ Sex: 48 y.o., female    PCP: Marisue BrooklynSaguier, Edward, PA-C   Reason for Endocrinology Evaluation: Elevated PTH      Date of Initial Endocrinology Evaluation: 03/28/2018     HPI: Ms. Virginia Allen is a 48 y.o. female with a past medical history of H.Pylori . The patient presented for initial endocrinology clinic visit on 03/28/2018 for consultative assistance with her elevated PTH.   She presented to her PCP with c/o loss of hair ,  weight loss , fatigue, anxiety, depression and eye pains.  She was noted to have low calcium and low B1 levels, she was also noted to have elevated PTH level as below.    LMP  last week , she has an IUD in place.    Father with metastatic bladder cancer.     HISTORY:  Past Medical History: Hx of H. Pylori Past Surgical History:  Past Surgical History:  Procedure Laterality Date  . EYE SURGERY     For astigmatism       Social History:  reports that she has never smoked. She has never used smokeless tobacco. She reports that she does not drink alcohol or use drugs.  Family History: family history includes Cancer in her father; Hypertension in her father and mother.   HOME MEDICATIONS: Allergies as of 03/28/2018   No Known Allergies     Medication List       Accurate as of March 28, 2018  2:56 PM. Always use your most recent med list.        omeprazole 20 MG capsule Commonly known as:  PRILOSEC Take 1 capsule (20 mg total) by mouth daily.   ondansetron 8 MG disintegrating tablet Commonly known as:  ZOFRAN ODT Take 1 tablet (8 mg total) by mouth every 8 (eight) hours as needed for nausea or vomiting.   venlafaxine XR 37.5 MG 24 hr capsule Commonly known as:  EFFEXOR XR Take 1 capsule (37.5 mg total) by mouth daily with breakfast.         REVIEW OF SYSTEMS: A comprehensive ROS was conducted with the patient and is negative except as per HPI and below:    Review of Systems  Constitutional: Positive for malaise/fatigue and weight loss.  HENT: Positive for congestion and sore throat.   Eyes: Positive for blurred vision and pain.  Respiratory: Negative for cough and shortness of breath.   Cardiovascular: Negative for chest pain and palpitations.  Gastrointestinal: Negative for constipation and diarrhea.  Genitourinary: Positive for frequency.  Neurological: Positive for tremors. Negative for tingling.  Endo/Heme/Allergies: Positive for polydipsia.  Psychiatric/Behavioral: Positive for depression. The patient is nervous/anxious.        OBJECTIVE:  VS: BP 120/62   Pulse 86   Ht 5\' 3"  (1.6 m)   SpO2 98%   BMI 29.62 kg/m    Wt Readings from Last 3 Encounters:  02/27/18 167 lb 3.2 oz (75.8 kg)  02/23/18 170 lb (77.1 kg)  02/23/18 170 lb 6.4 oz (77.3 kg)     EXAM: General: Pt appears well and is in NAD  Hydration: Well-hydrated with moist mucous membranes and good skin turgor  Eyes: External eye exam normal without stare, lid lag or exophthalmos.  EOM intact.  PERRL.  Ears, Nose, Throat: Hearing: Grossly intact bilaterally Dental: Good dentition  Throat: Clear without mass, erythema or exudate  Neck: General: Supple without adenopathy. Thyroid: Thyroid size  normal.  No goiter or nodules appreciated. No thyroid bruit.  Lungs: Clear with good BS bilat with no rales, rhonchi, or wheezes  Heart: Auscultation: RRR.  Abdomen: Normoactive bowel sounds, soft, nontender, without masses or organomegaly palpable  Extremities:  BLE: No pretibial edema normal ROM and strength.  Skin: Hair: Texture and amount normal with gender appropriate distribution Skin Inspection: No rashes Skin Palpation: Skin temperature, texture, and thickness normal to palpation  Neuro: Cranial nerves: II - XII grossly intact  Motor: Normal strength throughout DTRs: 2+ and symmetric in UE without delay in relaxation phase  Mental Status: Judgment, insight:  Intact Orientation: Oriented to time, place, and person Mood and affect: No depression, anxiety, or agitation     DATA REVIEWED:   Results for Virginia GeraldsBONILLA Allen, Cherise (MRN [id number]) as of 03/28/2018 14:11  Ref. Range 02/09/2018 16:17  Sodium Latest Ref Range: 135 - 145 mEq/L 141  Potassium Latest Ref Range: 3.5 - 5.1 mEq/L 4.3  Chloride Latest Ref Range: 96 - 112 mEq/L 104  CO2 Latest Ref Range: 19 - 32 mEq/L 30  Glucose Latest Ref Range: 70 - 99 mg/dL 90  BUN Latest Ref Range: 6 - 23 mg/dL 15  Creatinine Latest Ref Range: 0.40 - 1.20 mg/dL 1.910.83  Calcium Latest Ref Range: 8.4 - 10.5 mg/dL 8.8  Alkaline Phosphatase Latest Ref Range: 39 - 117 U/L 87  Albumin Latest Ref Range: 3.5 - 5.2 g/dL 4.0  AST Latest Ref Range: 0 - 37 U/L 9  ALT Latest Ref Range: 0 - 35 U/L 9  Total Protein Latest Ref Range: 6.0 - 8.3 g/dL 6.9  Total Bilirubin Latest Ref Range: 0.2 - 1.2 mg/dL 0.3  GFR Latest Ref Range: >60.00 mL/min 78.07   Results for Virginia GeraldsBONILLA Allen, Demiya (MRN [id number]) as of 03/28/2018 14:11  Ref. Range 01/13/2018 15:54  Vitamin D, 25-Hydroxy Latest Ref Range: 30 - 100 ng/mL 35   Results for Virginia GeraldsBONILLA Allen, Nimsi (MRN [id number]) as of 03/28/2018 14:11  Ref. Range 02/09/2018 16:17  PTH, Intact Latest Ref Range: 14 - 64 pg/mL 69 (H)   Results for Virginia GeraldsBONILLA Allen, Nely (MRN [id number]) as of 03/28/2018 14:11  Ref. Range 01/13/2018 15:54  TSH Latest Units: mIU/L 1.63  T4,Free(Direct) Latest Ref Range: 0.8 - 1.8 ng/dL 1.1    ASSESSMENT/PLAN/RECOMMENDATIONS:   1. 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 Her PTH is high due to low calcium intake/absorption.  - She was advised to eat a balanced diet, she was advised to consume about 1000-1200 mg of calcium daily in her diet, and if she is not able to keep up with that intake then I suggest she takes OTC calcium tablet 500 mg daily  - Will obtain BMP, PTH  and Vitamin D levels on her today  - Repeat labs today are normal, patient admits to improving her intake of calcium in the past month, hence normalization of labs.     F/U PRN   Signed electronically by: Lyndle HerrlichAbby Jaralla Jannelle Notaro, MD  [facility]  [facility] [address] [place], [place] [address] Phone: [phone] FAX: [phone]   CC: Marisue BrooklynSaguier, Edward, PA-C [id number] [place] [address] Phone: [phone] Fax: [phone]   Return to Endocrinology clinic as below: Future Appointments  Date Time Provider Department Center  04/06/2018  3:30 PM Levie HeritageStinson, Jacob J, DO [facility] None  06/28/2018  3:40 PM Jayant Kriz,  Melanie Crazier, MD [facility] None

## 2018-03-28 NOTE — Patient Instructions (Signed)
-   Your parathyroid hormone is elevated due to low calcium intake/absorption - Please make sure you consume about 1000-1200 mg of calcium daily  - FOODS THAT CONTAIN CALCIUM  Calcium can be found in many foods, not only in dairy products.  Dairy Foods  Yogurt (1 cup) 350 mg  Milk (1 cup) 300 mg  Cheddar cheese (1 oz.) 204 mg  Ricotta cheese, part skim (1/4 cup) 169 mg  Cottage cheese (1 cup) 150 mg  Nondairy Foods  Whole Grain Total cereal (3/4 cup) 1000 mg  Pink salmon with bones, sardines (3 oz., cooked) 181 mg  Black beans (1 cup) 103 mg  Broccoli (1 cup, cooked) 150 mg  Almonds (1 tbsp.) 50 mg  Soy Products  Soy yogurt with calcium (3/4 cup) 300 mg  Soy milk enriched with calcium (1 cup) 300 mg  Tofu, firm or extra firm (1/4 cup) 250 mg  Soy nuts, roasted/salted (1/2 cup) 103 mg

## 2018-03-29 LAB — PTH, INTACT AND CALCIUM
Calcium: 8.7 mg/dL (ref 8.6–10.2)
PTH: 56 pg/mL (ref 14–64)

## 2018-04-06 ENCOUNTER — Ambulatory Visit (INDEPENDENT_AMBULATORY_CARE_PROVIDER_SITE_OTHER): Payer: BLUE CROSS/BLUE SHIELD | Admitting: Family Medicine

## 2018-04-06 VITALS — BP 98/61 | HR 67 | Ht 63.0 in | Wt 170.0 lb

## 2018-04-06 DIAGNOSIS — N921 Excessive and frequent menstruation with irregular cycle: Secondary | ICD-10-CM | POA: Diagnosis not present

## 2018-04-06 DIAGNOSIS — Z30431 Encounter for routine checking of intrauterine contraceptive device: Secondary | ICD-10-CM | POA: Diagnosis not present

## 2018-04-06 MED ORDER — MEDROXYPROGESTERONE ACETATE 10 MG PO TABS: 10.0000 mg | ORAL_TABLET | Freq: Every day | ORAL | 0 refills | Status: DC

## 2018-04-06 NOTE — Progress Notes (Signed)
   Subjective:   Patient Name: Virginia Allen, female   DOB: June 11, 1970, 48 y.o.  MRN: [id number]  HPI Patient here for an IUD check.  She had the Liletta IUD placed 1 month ago.  She reports bleeding every day since IUD was inserted. Seems a little lighter.   Review of Systems  Constitutional: Negative for fever and chills.  Gastrointestinal: Negative for abdominal pain.  Genitourinary: Negative for vaginal discharge, vaginal pain, pelvic pain and dyspareunia.        Objective:   Physical Exam  Constitutional: She appears well-developed and well-nourished.  HENT:  Head: Normocephalic and atraumatic.  Abdominal: Soft. There is no tenderness. There is no guarding.  Genitourinary: There is no rash, tenderness or lesion on the right labia. There is no rash, tenderness or lesion on the left labia. No erythema or tenderness in the vagina. No foreign body around the vagina. No signs of injury around the vagina. No vaginal discharge found.    Skin: Skin is warm and dry.  Psychiatric: She has a normal mood and affect. Her behavior is normal. Judgment and thought content normal.       Assessment & Plan:  1. IUD check up IUD in place. Will prescribe provera for breakthough bleeding. Pt to call with any other problems.  Recheck in 1 year.  2. Break through bleeding

## 2018-04-06 NOTE — Progress Notes (Signed)
Patient has had some light bleeding since insertion of IUD. Armandina Stammer RN

## 2018-06-28 ENCOUNTER — Ambulatory Visit: Payer: BLUE CROSS/BLUE SHIELD | Admitting: Internal Medicine

## 2018-06-30 ENCOUNTER — Telehealth: Payer: Self-pay | Admitting: Medical

## 2018-06-30 NOTE — Telephone Encounter (Signed)
On review appears I put pt on medications(effexor) in December but she never followed up. Would you call her and offer virtual follow up visit.

## 2018-07-01 DIAGNOSIS — J189 Pneumonia, unspecified organism: Secondary | ICD-10-CM | POA: Diagnosis not present

## 2018-07-01 DIAGNOSIS — Z20828 Contact with and (suspected) exposure to other viral communicable diseases: Secondary | ICD-10-CM | POA: Diagnosis not present

## 2018-07-01 DIAGNOSIS — R918 Other nonspecific abnormal finding of lung field: Secondary | ICD-10-CM | POA: Diagnosis not present

## 2018-07-01 DIAGNOSIS — R0602 Shortness of breath: Secondary | ICD-10-CM | POA: Diagnosis not present

## 2018-07-01 DIAGNOSIS — B349 Viral infection, unspecified: Secondary | ICD-10-CM | POA: Diagnosis not present

## 2018-07-01 DIAGNOSIS — J1289 Other viral pneumonia: Secondary | ICD-10-CM | POA: Diagnosis not present

## 2018-07-01 DIAGNOSIS — R05 Cough: Secondary | ICD-10-CM | POA: Diagnosis not present

## 2018-07-01 DIAGNOSIS — R6889 Other general symptoms and signs: Secondary | ICD-10-CM | POA: Diagnosis not present

## 2018-07-01 DIAGNOSIS — J9601 Acute respiratory failure with hypoxia: Secondary | ICD-10-CM | POA: Diagnosis not present

## 2018-07-01 DIAGNOSIS — R0600 Dyspnea, unspecified: Secondary | ICD-10-CM | POA: Diagnosis not present

## 2018-07-01 DIAGNOSIS — R0902 Hypoxemia: Secondary | ICD-10-CM | POA: Diagnosis not present

## 2018-07-02 DIAGNOSIS — R0902 Hypoxemia: Secondary | ICD-10-CM | POA: Diagnosis not present

## 2018-07-02 DIAGNOSIS — J1289 Other viral pneumonia: Secondary | ICD-10-CM | POA: Diagnosis not present

## 2018-07-02 DIAGNOSIS — R6889 Other general symptoms and signs: Secondary | ICD-10-CM | POA: Diagnosis not present

## 2018-07-03 DIAGNOSIS — R6889 Other general symptoms and signs: Secondary | ICD-10-CM | POA: Diagnosis not present

## 2018-07-03 DIAGNOSIS — J1289 Other viral pneumonia: Secondary | ICD-10-CM | POA: Diagnosis not present

## 2018-07-03 DIAGNOSIS — R0902 Hypoxemia: Secondary | ICD-10-CM | POA: Diagnosis not present

## 2018-07-03 NOTE — Telephone Encounter (Signed)
LVM in spanish for pt to call the office and schedule VOV with provider.

## 2018-07-04 DIAGNOSIS — R0902 Hypoxemia: Secondary | ICD-10-CM | POA: Diagnosis not present

## 2018-07-04 DIAGNOSIS — R6889 Other general symptoms and signs: Secondary | ICD-10-CM | POA: Diagnosis not present

## 2018-07-04 DIAGNOSIS — J1289 Other viral pneumonia: Secondary | ICD-10-CM | POA: Diagnosis not present

## 2018-07-05 DIAGNOSIS — R0902 Hypoxemia: Secondary | ICD-10-CM | POA: Diagnosis not present

## 2018-07-05 DIAGNOSIS — R6889 Other general symptoms and signs: Secondary | ICD-10-CM | POA: Diagnosis not present

## 2018-07-05 DIAGNOSIS — J1289 Other viral pneumonia: Secondary | ICD-10-CM | POA: Diagnosis not present

## 2018-07-24 DIAGNOSIS — R0602 Shortness of breath: Secondary | ICD-10-CM | POA: Diagnosis not present

## 2018-07-24 DIAGNOSIS — Z8619 Personal history of other infectious and parasitic diseases: Secondary | ICD-10-CM | POA: Diagnosis not present

## 2018-07-24 DIAGNOSIS — R9431 Abnormal electrocardiogram [ECG] [EKG]: Secondary | ICD-10-CM | POA: Diagnosis not present

## 2018-07-24 DIAGNOSIS — R918 Other nonspecific abnormal finding of lung field: Secondary | ICD-10-CM | POA: Diagnosis not present

## 2018-07-24 DIAGNOSIS — U071 COVID-19: Secondary | ICD-10-CM | POA: Diagnosis not present

## 2018-07-24 DIAGNOSIS — R05 Cough: Secondary | ICD-10-CM | POA: Diagnosis not present

## 2018-07-24 DIAGNOSIS — R002 Palpitations: Secondary | ICD-10-CM | POA: Diagnosis not present

## 2018-07-25 DIAGNOSIS — U071 COVID-19: Secondary | ICD-10-CM | POA: Diagnosis not present

## 2018-07-25 DIAGNOSIS — R05 Cough: Secondary | ICD-10-CM | POA: Diagnosis not present

## 2018-07-25 DIAGNOSIS — Z6828 Body mass index (BMI) 28.0-28.9, adult: Secondary | ICD-10-CM | POA: Diagnosis not present

## 2018-07-26 ENCOUNTER — Other Ambulatory Visit: Payer: Self-pay

## 2018-07-26 ENCOUNTER — Ambulatory Visit: Payer: BLUE CROSS/BLUE SHIELD | Admitting: Internal Medicine

## 2018-07-26 NOTE — Progress Notes (Deleted)
Virtual Visit via Video Note  I connected with Virginia Allen on 07/26/18 at 10:30 AM EDT by a video enabled telemedicine application and verified that I am speaking with the correct person using two identifiers.   I discussed the limitations of evaluation and management by telemedicine and the availability of in person appointments. The patient expressed understanding and agreed to proceed.  -Location of the patient : -Location of the provider : Office -The names of all persons participating in the telemedicine service : Pt and myself    Name: Virginia Allen  MRN/ DOB: [id number], 12/16/1970    Age/ Sex: 48 y.o., female     PCP: Saguier, Kateri Mc   Reason for Endocrinology Evaluation: Elevated PTH     Initial Endocrinology Clinic Visit:     PATIENT IDENTIFIER: Virginia Allen is a 48 y.o., female with a past medical history of H.Pylori. She has followed with [facility] since 03/28/2018 for consultative assistance with management of her elevated PTH.   HISTORICAL SUMMARY: The patient was first noted to have elevated PTH in November,2019 during work up for loss of hair ,  weight loss , fatigue, anxiety, depression and eye pains.  She was also noted to have low serum calcium with a nadir of 8.5 mg/dL (non-corrected) with normal Vitamin D at 35 ng/dL but a low B1. Pt was on low calcium diet for a long time.    SUBJECTIVE:   During last visit (03/28/2018): PTH, Serum calcium and vitamin D have all normalized.   Today (07/26/2018):  Virginia Allen is here for a virtual 3 month follow up on secondary hyperparathyroidism.      ROS:  As per HPI.   HISTORY:   Past Medical History: No past medical history on file. Past Surgical History:  Past Surgical History:  Procedure Laterality Date  . EYE SURGERY     For astigmatism      Social History:  reports that she has never smoked. She has never used smokeless tobacco. She reports  that she does not drink alcohol or use drugs. Family History:  Family History  Problem Relation Age of Onset  . Hypertension Father   . Cancer Father   . Hypertension Mother       HOME MEDICATIONS: Allergies as of 07/26/2018   No Known Allergies     Medication List       Accurate as of Jul 26, 2018  7:54 AM. Always use your most recent med list.        LILETTA (52 MG) IU by Intrauterine route.   medroxyPROGESTERone 10 MG tablet Commonly known as:  PROVERA Take 1 tablet (10 mg total) by mouth daily.   omeprazole 20 MG capsule Commonly known as:  PRILOSEC Take 1 capsule (20 mg total) by mouth daily.   ondansetron 8 MG disintegrating tablet Commonly known as:  Zofran ODT Take 1 tablet (8 mg total) by mouth every 8 (eight) hours as needed for nausea or vomiting.   venlafaxine XR 37.5 MG 24 hr capsule Commonly known as:  Effexor XR Take 1 capsule (37.5 mg total) by mouth daily with breakfast.          DATA REVIEWED: ***    ASSESSMENT / PLAN / RECOMMENDATIONS:   1. 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 The patient was provided  an opportunity to ask questions and all were answered. The patient agreed with the plan and demonstrated an understanding of the instructions.   The patient was advised to call back or seek an in-person evaluation if the symptoms worsen or if the condition fails to improve as anticipated.  I provided *** minutes of non-face-to-face time during this encounter.   Signed electronically by: Lyndle HerrlichAbby Jaralla Zaylee Cornia, MD  [facility]  [facility] [address] [place], [place] [address] Phone: [phone] FAX: [phone]   CC: Marisue BrooklynSaguier, Edward, PA-C [id number] [facility] [address] [place] [address] Phone: [phone] Fax: [phone]   Return to Endocrinology clinic  as below: Future Appointments  Date Time Provider Department Center  07/26/2018 10:30 AM Marysa Wessner, Konrad DoloresIbtehal Jaralla, MD [facility] None

## 2018-09-19 DIAGNOSIS — Z20828 Contact with and (suspected) exposure to other viral communicable diseases: Secondary | ICD-10-CM | POA: Diagnosis not present

## 2019-05-01 ENCOUNTER — Other Ambulatory Visit: Payer: Self-pay

## 2019-05-02 ENCOUNTER — Other Ambulatory Visit: Payer: Self-pay

## 2019-05-02 ENCOUNTER — Ambulatory Visit (HOSPITAL_BASED_OUTPATIENT_CLINIC_OR_DEPARTMENT_OTHER)
Admission: RE | Admit: 2019-05-02 | Discharge: 2019-05-02 | Disposition: A | Discharge status: Home / Self Care | Payer: BC Managed Care – PPO | Source: Ambulatory Visit | Attending: Medical | Admitting: Medical

## 2019-05-02 ENCOUNTER — Ambulatory Visit (INDEPENDENT_AMBULATORY_CARE_PROVIDER_SITE_OTHER): Payer: BC Managed Care – PPO | Admitting: Medical

## 2019-05-02 ENCOUNTER — Telehealth: Payer: Self-pay | Admitting: Medical

## 2019-05-02 VITALS — BP 108/59 | HR 85 | Temp 97.0°F | Resp 12 | Ht 63.0 in | Wt 195.4 lb

## 2019-05-02 DIAGNOSIS — R519 Headache, unspecified: Secondary | ICD-10-CM | POA: Diagnosis not present

## 2019-05-02 DIAGNOSIS — M533 Sacrococcygeal disorders, not elsewhere classified: Secondary | ICD-10-CM

## 2019-05-02 DIAGNOSIS — R5383 Other fatigue: Secondary | ICD-10-CM

## 2019-05-02 DIAGNOSIS — R4182 Altered mental status, unspecified: Secondary | ICD-10-CM | POA: Diagnosis not present

## 2019-05-02 DIAGNOSIS — R42 Dizziness and giddiness: Secondary | ICD-10-CM

## 2019-05-02 MED ORDER — MECLIZINE HCL 12.5 MG PO TABS: 12.5000 mg | ORAL_TABLET | Freq: Three times a day (TID) | ORAL | 0 refills | Status: DC | PRN

## 2019-05-02 NOTE — Addendum Note (Signed)
Addended by: Gwenevere Abbot on: 05/02/2019 04:10 PM   Modules accepted: Orders

## 2019-05-02 NOTE — Patient Instructions (Signed)
For your history of intermittent episodes of headache with dizziness and sometimes confusion, I decided to go ahead and place CT of the head without contrast order.  We will see if this is prior authorized by your insurance.  During the interim you can use meclizine for dizziness.  For headache she can use Tylenol, ibuprofen for combination of both as explained.  If you have any severe headaches with any motor or sensory deficits then recommend ED evaluation.  Will follow CT and see how you do.  With your reported episodes of confusion at times and switching words intermittently probably after review of CT refer you to neurologist.  For fatigue, please get listed labs today.  For coccyx region pain described recently will get coccyx and sacrum x-ray.  Follow-up date to be determined after lab review.

## 2019-05-02 NOTE — Telephone Encounter (Signed)
CT of head placed. Can you try to get prior auth

## 2019-05-02 NOTE — Progress Notes (Signed)
Subjective:    Patient ID: Virginia Allen, female    DOB: Jan 13, 1971, 49 y.o.   MRN: [id number]  HPI  Pt in for history of fatigue,  dizziness, mild ha and altered mental status.   She is very tired.   Pt states at work talking on phone for 5 minutes will cause her extreme fatigue. This has been going on for about 2 weeks.   Pt has not been working out. She has gained some weight.   Pt has transient episodes dizziness with moderate to severe ha. States has severe pressure at times in her head. Last 7 days had 6 ha. At present very low level ha. Sometimes with headache will get sense of confusion. Switches words at time transiently. No light of sound senstivitive ha.  No hx of chronic ha.      Review of Systems  Constitutional: Positive for fatigue.  HENT: Negative for congestion, ear discharge, ear pain and hearing loss.   Respiratory: Negative for cough, chest tightness, shortness of breath and wheezing.   Cardiovascular: Negative for chest pain and palpitations.  Gastrointestinal: Negative for abdominal pain.  Genitourinary: Negative for dysuria and flank pain.  Musculoskeletal: Negative for back pain, gait problem and myalgias.  Neurological: Positive for dizziness, light-headedness and headaches.  Hematological: Negative for adenopathy. Does not bruise/bleed easily.  Psychiatric/Behavioral: Negative for behavioral problems and dysphoric mood.    No past medical history on file.   Social History   Socioeconomic History  . Marital status: Single    Spouse name: Not on file  . Number of children: Not on file  . Years of education: Not on file  . Highest education level: Not on file  Occupational History  . Not on file  Tobacco Use  . Smoking status: Never Smoker  . Smokeless tobacco: Never Used  Substance and Sexual Activity  . Alcohol use: Never    Comment: rare  . Drug use: Never  . Sexual activity: Yes    Birth control/protection: None  Other  Topics Concern  . Not on file  Social History Narrative  . Not on file   Social Determinants of Health   Financial Resource Strain:   . Difficulty of Paying Living Expenses: Not on file  Food Insecurity:   . Worried About Charity fundraiser in the Last Year: Not on file  . Ran Out of Food in the Last Year: Not on file  Transportation Needs:   . Lack of Transportation (Medical): Not on file  . Lack of Transportation (Non-Medical): Not on file  Physical Activity:   . Days of Exercise per Week: Not on file  . Minutes of Exercise per Session: Not on file  Stress:   . Feeling of Stress : Not on file  Social Connections:   . Frequency of Communication with Friends and Family: Not on file  . Frequency of Social Gatherings with Friends and Family: Not on file  . Attends Religious Services: Not on file  . Active Member of Clubs or Organizations: Not on file  . Attends Archivist Meetings: Not on file  . Marital Status: Not on file  Intimate Partner Violence:   . Fear of Current or Ex-Partner: Not on file  . Emotionally Abused: Not on file  . Physically Abused: Not on file  . Sexually Abused: Not on file    Past Surgical History:  Procedure Laterality Date  . EYE SURGERY     For astigmatism  Family History  Problem Relation Age of Onset  . Hypertension Father   . Cancer Father   . Hypertension Mother     No Known Allergies  Current Outpatient Medications on File Prior to Visit  Medication Sig Dispense Refill  . Levonorgestrel (LILETTA, 52 MG, IU) by Intrauterine route.     No current facility-administered medications on file prior to visit.    BP (!) 108/59 (BP Location: Right Arm, Cuff Size: Large)   Pulse 85   Temp (!) 97 F (36.1 C) (Temporal)   Resp 12   Ht 5\' 3"  (1.6 m)   Wt 195 lb 6.4 oz (88.6 kg)   SpO2 98%   BMI 34.61 kg/m       Objective:   Physical Exam  General Mental Status- Alert. General Appearance- Not in acute distress.    Skin General: Color- Normal Color. Moisture- Normal Moisture.  Neck Carotid Arteries- Normal color. Moisture- Normal Moisture. No carotid bruits. No JVD.  Chest and Lung Exam Auscultation: Breath Sounds:-Normal.  Cardiovascular Auscultation:Rythm- Regular. Murmurs & Other Heart Sounds:Auscultation of the heart reveals- No Murmurs.  Abdomen Inspection:-Inspeection Normal. Palpation/Percussion:Note:No mass. Palpation and Percussion of the abdomen reveal- Non Tender, Non Distended + BS, no rebound or guarding.    Neurologic Cranial Nerve exam:- CN III-XII intact(No nystagmus), symmetric smile. Normal/Intact Strength:- 5/5 equal and symmetric strength both upper and lower extremities.      Assessment & Plan:  For your history of intermittent episodes of headache with dizziness and sometimes confusion, I decided to go ahead and place CT of the head without contrast order.  We will see if this is prior authorized by your insurance.  During the interim you can use meclizine for dizziness.  For headache she can use Tylenol, ibuprofen for combination of both as explained.  If you have any severe headaches with any motor or sensory deficits then recommend ED evaluation.  Will follow CT and see how you do.  With your reported episodes of confusion at times and switching words intermittently probably after review of CT refer you to neurologist.  For fatigue, please get listed labs today.  For coccyx region pain described recently will get coccyx and sacrum x-ray.  Follow-up date to be determined after lab review.  , PA-C

## 2019-05-02 NOTE — Telephone Encounter (Signed)
Ct head placed.

## 2019-05-03 ENCOUNTER — Telehealth: Payer: Self-pay | Admitting: Medical

## 2019-05-03 DIAGNOSIS — M533 Sacrococcygeal disorders, not elsewhere classified: Secondary | ICD-10-CM

## 2019-05-03 LAB — CBC WITH DIFFERENTIAL/PLATELET
Basophils Absolute: 0.1 10*3/uL (ref 0.0–0.1)
Basophils Relative: 1.1 % (ref 0.0–3.0)
Eosinophils Absolute: 0.2 10*3/uL (ref 0.0–0.7)
Eosinophils Relative: 1.8 % (ref 0.0–5.0)
HCT: 38.8 % (ref 36.0–46.0)
Hemoglobin: 12.7 g/dL (ref 12.0–15.0)
Lymphocytes Relative: 22.2 % (ref 12.0–46.0)
Lymphs Abs: 2.5 10*3/uL (ref 0.7–4.0)
MCHC: 32.6 g/dL (ref 30.0–36.0)
MCV: 84.9 fl (ref 78.0–100.0)
Monocytes Absolute: 0.7 10*3/uL (ref 0.1–1.0)
Monocytes Relative: 5.8 % (ref 3.0–12.0)
Neutro Abs: 7.8 10*3/uL — ABNORMAL HIGH (ref 1.4–7.7)
Neutrophils Relative %: 69.1 % (ref 43.0–77.0)
Platelets: 261 10*3/uL (ref 150.0–400.0)
RBC: 4.56 Mil/uL (ref 3.87–5.11)
RDW: 13.6 % (ref 11.5–15.5)
WBC: 11.3 10*3/uL — ABNORMAL HIGH (ref 4.0–10.5)

## 2019-05-03 LAB — COMPREHENSIVE METABOLIC PANEL
ALT: 21 U/L (ref 0–35)
AST: 16 U/L (ref 0–37)
Albumin: 3.9 g/dL (ref 3.5–5.2)
Alkaline Phosphatase: 84 U/L (ref 39–117)
BUN: 18 mg/dL (ref 6–23)
CO2: 28 mEq/L (ref 19–32)
Calcium: 8.9 mg/dL (ref 8.4–10.5)
Chloride: 105 mEq/L (ref 96–112)
Creatinine, Ser: 0.7 mg/dL (ref 0.40–1.20)
GFR: 88.95 mL/min (ref >60.00)
Glucose, Bld: 83 mg/dL (ref 70–99)
Potassium: 4.6 mEq/L (ref 3.5–5.1)
Sodium: 139 mEq/L (ref 135–145)
Total Bilirubin: 0.2 mg/dL (ref 0.2–1.2)
Total Protein: 7.2 g/dL (ref 6.0–8.3)

## 2019-05-03 LAB — VITAMIN B12: Vitamin B-12: 268 pg/mL (ref 211–911)

## 2019-05-03 LAB — TSH: TSH: 2.76 u[IU]/mL (ref 0.35–4.50)

## 2019-05-03 NOTE — Telephone Encounter (Signed)
Recent to sports medicine placed.

## 2019-05-05 LAB — VITAMIN D 1,25 DIHYDROXY
Vitamin D 1, 25 (OH)2 Total: 60 pg/mL (ref 18–72)
Vitamin D2 1, 25 (OH)2: <8 pg/mL
Vitamin D3 1, 25 (OH)2: 60 pg/mL

## 2019-05-06 LAB — VITAMIN B1: Vitamin B1 (Thiamine): 10 nmol/L (ref 8–30)

## 2019-05-07 ENCOUNTER — Telehealth: Payer: Self-pay | Admitting: Medical

## 2019-05-07 ENCOUNTER — Other Ambulatory Visit: Payer: Self-pay

## 2019-05-07 ENCOUNTER — Ambulatory Visit (INDEPENDENT_AMBULATORY_CARE_PROVIDER_SITE_OTHER): Payer: BC Managed Care – PPO | Admitting: Family Medicine

## 2019-05-07 ENCOUNTER — Encounter: Payer: Self-pay | Admitting: Family Medicine

## 2019-05-07 ENCOUNTER — Ambulatory Visit (HOSPITAL_BASED_OUTPATIENT_CLINIC_OR_DEPARTMENT_OTHER)
Admission: RE | Admit: 2019-05-07 | Discharge: 2019-05-07 | Disposition: A | Discharge status: Home / Self Care | Payer: BC Managed Care – PPO | Source: Ambulatory Visit | Attending: Medical | Admitting: Medical

## 2019-05-07 DIAGNOSIS — R519 Headache, unspecified: Secondary | ICD-10-CM

## 2019-05-07 DIAGNOSIS — R42 Dizziness and giddiness: Secondary | ICD-10-CM | POA: Diagnosis not present

## 2019-05-07 DIAGNOSIS — S0990XA Unspecified injury of head, initial encounter: Secondary | ICD-10-CM | POA: Diagnosis not present

## 2019-05-07 DIAGNOSIS — M533 Sacrococcygeal disorders, not elsewhere classified: Secondary | ICD-10-CM

## 2019-05-07 DIAGNOSIS — R4182 Altered mental status, unspecified: Secondary | ICD-10-CM | POA: Diagnosis not present

## 2019-05-07 MED ORDER — PREDNISONE 5 MG PO TABS: ORAL_TABLET | ORAL | 0 refills | Status: DC

## 2019-05-07 MED ORDER — PENNSAID 2 % EX SOLN: 1.0000 "application " | Freq: Two times a day (BID) | CUTANEOUS | 3 refills | Status: DC

## 2019-05-07 NOTE — Progress Notes (Signed)
Medication Samples have been provided to the patient.  Drug name: Pennsaid       Strength: 2%        Qty: 1 Box  LOT: [id number]  Exp.Date: 06/2019  Dosing instructions: Use a pea size amount and rub gently.  The patient has been instructed regarding the correct time, dose, and frequency of taking this medication, including desired effects and most common side effects.   Kathi Simpers, MA 4:16 PM 05/07/2019

## 2019-05-07 NOTE — Assessment & Plan Note (Signed)
Acute on chronic in nature.  Does have a posterior displacement not remember any trauma. -Prednisone. -Pennsaid and provided samples. -Counseled on supportive care. -If no improvement will consider physical therapy

## 2019-05-07 NOTE — Progress Notes (Signed)
Virginia Allen - 49 y.o. female MRN [id number]  Date of birth: 30-Apr-1970  SUBJECTIVE:  Including CC & ROS.  Chief Complaint  Patient presents with  . Tailbone Pain    Virginia Allen is a 48 y.o. female that is presenting with pain at the bottom of her tailbone.  This been ongoing for about a year.  She initially felt the pain was related to long car rides when she was driving to [place].  The pain has persisted since that time.  She feels the pain with sitting as well as standing.  It seems to be localized to the bottom of the tailbone.  No specific inciting event..  Independent review of the sacrum from 2/10 shows slight displacement of the distal coccygeal segment.   Review of Systems See HPI   HISTORY: Past Medical, Surgical, Social, and Family History Reviewed & Updated per EMR.   Pertinent Historical Findings include:  No past medical history on file.  Past Surgical History:  Procedure Laterality Date  . EYE SURGERY     For astigmatism     Family History  Problem Relation Age of Onset  . Hypertension Father   . Cancer Father   . Hypertension Mother     Social History   Socioeconomic History  . Marital status: Single    Spouse name: Not on file  . Number of children: Not on file  . Years of education: Not on file  . Highest education level: Not on file  Occupational History  . Not on file  Tobacco Use  . Smoking status: Never Smoker  . Smokeless tobacco: Never Used  Substance and Sexual Activity  . Alcohol use: Never    Comment: rare  . Drug use: Never  . Sexual activity: Yes    Birth control/protection: None  Other Topics Concern  . Not on file  Social History Narrative  . Not on file   Social Determinants of Health   Financial Resource Strain:   . Difficulty of Paying Living Expenses: Not on file  Food Insecurity:   . Worried About Charity fundraiser in the Last Year: Not on file  . Ran Out of Food in the Last Year: Not on  file  Transportation Needs:   . Lack of Transportation (Medical): Not on file  . Lack of Transportation (Non-Medical): Not on file  Physical Activity:   . Days of Exercise per Week: Not on file  . Minutes of Exercise per Session: Not on file  Stress:   . Feeling of Stress : Not on file  Social Connections:   . Frequency of Communication with Friends and Family: Not on file  . Frequency of Social Gatherings with Friends and Family: Not on file  . Attends Religious Services: Not on file  . Active Member of Clubs or Organizations: Not on file  . Attends Archivist Meetings: Not on file  . Marital Status: Not on file  Intimate Partner Violence:   . Fear of Current or Ex-Partner: Not on file  . Emotionally Abused: Not on file  . Physically Abused: Not on file  . Sexually Abused: Not on file     PHYSICAL EXAM:  VS: BP 115/69   Pulse 76   Ht 5\' 5"  (1.651 m)   Wt 195 lb (88.5 kg)   LMP 04/18/2019   BMI 32.45 kg/m  Physical Exam Gen: NAD, alert, cooperative with exam, well-appearing MSK:  Back: Tenderness to palpation at the  coccyx. Normal flexion and extension. Normal strength resistance with hip flexion. Normal internal and external rotation. Neurovascularly intact     ASSESSMENT & PLAN:   Coccydynia Acute on chronic in nature.  Does have a posterior displacement not remember any trauma. -Prednisone. -Pennsaid and provided samples. -Counseled on supportive care. -If no improvement will consider physical therapy

## 2019-05-07 NOTE — Telephone Encounter (Signed)
error 

## 2019-05-07 NOTE — Patient Instructions (Signed)
Nice to meet you Please try the prednisone  Please try the rub on medicine after the prednisone  Please try ice  Please try to get a donut pillow to sit on   Please send me a message in MyChart with any questions or updates.  Please see me back in 4 weeks .   --Dr. Jordan Likes

## 2019-05-24 ENCOUNTER — Other Ambulatory Visit: Payer: Self-pay

## 2019-05-25 ENCOUNTER — Encounter: Payer: BC Managed Care – PPO | Admitting: Medical

## 2019-05-27 ENCOUNTER — Telehealth: Payer: Self-pay | Admitting: Medical

## 2019-05-27 NOTE — Telephone Encounter (Signed)
Pt had appointment on 05/25/2019. She left since she got call from covid vaccine center and they moved up her vaccine appointment. So she chose to leave and I asked her to rescheudule later next week.

## 2019-05-30 ENCOUNTER — Ambulatory Visit: Payer: BC Managed Care – PPO | Admitting: Medical

## 2019-05-30 ENCOUNTER — Ambulatory Visit (HOSPITAL_BASED_OUTPATIENT_CLINIC_OR_DEPARTMENT_OTHER)
Admission: RE | Admit: 2019-05-30 | Discharge: 2019-05-30 | Disposition: A | Discharge status: Home / Self Care | Payer: BC Managed Care – PPO | Source: Ambulatory Visit | Attending: Medical | Admitting: Medical

## 2019-05-30 ENCOUNTER — Other Ambulatory Visit: Payer: Self-pay

## 2019-05-30 ENCOUNTER — Ambulatory Visit (INDEPENDENT_AMBULATORY_CARE_PROVIDER_SITE_OTHER): Payer: BC Managed Care – PPO | Admitting: Medical

## 2019-05-30 ENCOUNTER — Encounter: Payer: Self-pay | Admitting: Medical

## 2019-05-30 VITALS — BP 100/60 | HR 80 | Temp 97.5°F | Ht 65.0 in | Wt 197.0 lb

## 2019-05-30 DIAGNOSIS — R42 Dizziness and giddiness: Secondary | ICD-10-CM

## 2019-05-30 DIAGNOSIS — R0600 Dyspnea, unspecified: Secondary | ICD-10-CM

## 2019-05-30 DIAGNOSIS — R5383 Other fatigue: Secondary | ICD-10-CM

## 2019-05-30 DIAGNOSIS — R05 Cough: Secondary | ICD-10-CM | POA: Diagnosis not present

## 2019-05-30 DIAGNOSIS — I959 Hypotension, unspecified: Secondary | ICD-10-CM

## 2019-05-30 DIAGNOSIS — R0602 Shortness of breath: Secondary | ICD-10-CM | POA: Diagnosis not present

## 2019-05-30 MED ORDER — ALBUTEROL SULFATE HFA 108 (90 BASE) MCG/ACT IN AERS: 2.0000 | INHALATION_SPRAY | Freq: Four times a day (QID) | RESPIRATORY_TRACT | 0 refills | Status: DC | PRN

## 2019-05-30 NOTE — Progress Notes (Signed)
21 

## 2019-05-30 NOTE — Progress Notes (Signed)
Subjective:    Patient ID: Virginia Allen, female    DOB: 07-30-1970, 49 y.o.   MRN: [id number]  HPI  Pt in for evaluation.  Pt just got covid vaccine on Friday.  Pt does describe feeling of some slight light headed, dizziness, near syncope and fatigue.(at times states near syncope) when she breaths deeply does not pass out.  This was before her vaccine. But her baseline symptoms are worse.   On last visit did ct of head to evaluate has, dizziness and episodes of confusion.   IMPRESSION: Normal non-contrast CT appearance of the brain. No evidence of acute intracranial abnormality.  Labs done for fatigue in the past. Lab work up was negative only mild wbc elevation. No black stools. No heavy menses. One month ago no anemia.  Pt bp is on lower side today. On recheck similar.  Pt has some sob recently past 2 weeks. No hx of wheezing, no asthma and no chest pain. Hx of covid since April. It took until September for her endurance to improve. Was hospitalized for one week.  No leg pain. No calfs swollen.         Review of Systems  Constitutional: Positive for fatigue. Negative for chills and fever.  Respiratory: Positive for shortness of breath. Negative for cough, choking and wheezing.   Cardiovascular: Negative for chest pain and palpitations.    No past medical history on file.   Social History   Socioeconomic History  . Marital status: Single    Spouse name: Not on file  . Number of children: Not on file  . Years of education: Not on file  . Highest education level: Not on file  Occupational History  . Not on file  Tobacco Use  . Smoking status: Never Smoker  . Smokeless tobacco: Never Used  Substance and Sexual Activity  . Alcohol use: Never    Comment: rare  . Drug use: Never  . Sexual activity: Yes    Birth control/protection: None  Other Topics Concern  . Not on file  Social History Narrative  . Not on file   Social Determinants of Health     Financial Resource Strain:   . Difficulty of Paying Living Expenses: Not on file  Food Insecurity:   . Worried About Charity fundraiser in the Last Year: Not on file  . Ran Out of Food in the Last Year: Not on file  Transportation Needs:   . Lack of Transportation (Medical): Not on file  . Lack of Transportation (Non-Medical): Not on file  Physical Activity:   . Days of Exercise per Week: Not on file  . Minutes of Exercise per Session: Not on file  Stress:   . Feeling of Stress : Not on file  Social Connections:   . Frequency of Communication with Friends and Family: Not on file  . Frequency of Social Gatherings with Friends and Family: Not on file  . Attends Religious Services: Not on file  . Active Member of Clubs or Organizations: Not on file  . Attends Archivist Meetings: Not on file  . Marital Status: Not on file  Intimate Partner Violence:   . Fear of Current or Ex-Partner: Not on file  . Emotionally Abused: Not on file  . Physically Abused: Not on file  . Sexually Abused: Not on file    Past Surgical History:  Procedure Laterality Date  . EYE SURGERY     For astigmatism  Family History  Problem Relation Age of Onset  . Hypertension Father   . Cancer Father   . Hypertension Mother     No Known Allergies  Current Outpatient Medications on File Prior to Visit  Medication Sig Dispense Refill  . Diclofenac Sodium (PENNSAID) 2 % SOLN Place 1 application onto the skin 2 (two) times daily. 112 g 3  . Levonorgestrel (LILETTA, 52 MG, IU) by Intrauterine route.    . meclizine (ANTIVERT) 12.5 MG tablet Take 1 tablet (12.5 mg total) by mouth 3 (three) times daily as needed for dizziness. 30 tablet 0  . predniSONE (DELTASONE) 5 MG tablet Take 6 pills for first day, 5 pills second day, 4 pills third day, 3 pills fourth day, 2 pills the fifth day, and 1 pill sixth day. 21 tablet 0   No current facility-administered medications on file prior to visit.     BP 100/60   Pulse 80   Temp (!) 97.5 F (36.4 C) (Temporal)   Ht 5\' 5"  (1.651 m)   Wt 197 lb (89.4 kg)   LMP 05/07/2019 (Approximate)   SpO2 98%   BMI 32.78 kg/m       Objective:   Physical Exam  General Mental Status- Alert. General Appearance- Not in acute distress.   Skin General: Color- Normal Color. Moisture- Normal Moisture.  Neck Carotid Arteries- Normal color. Moisture- Normal Moisture. No carotid bruits. No JVD.  Chest and Lung Exam Auscultation: Breath Sounds:-Normal.  Cardiovascular Auscultation:Rythm- Regular. Murmurs & Other Heart Sounds:Auscultation of the heart reveals- No Murmurs.  Abdomen Inspection:-Inspeection Normal. Palpation/Percussion:Note:No mass. Palpation and Percussion of the abdomen reveal- Non Tender, Non Distended + BS, no rebound or guarding.    Neurologic Cranial Nerve exam:- CN III-XII intact(No nystagmus), symmetric smile. Drift Test:- No drift. Romberg Exam:- Negative.  Heal to Toe Gait exam:-Normal. Finger to Nose:- Normal/Intact Strength:- 5/5 equal and symmetric strength both upper and lower extremities.      Assessment & Plan:  Your blood pressure is on lower end today. I want you to get blood pressure cuff over the counter and check bp daily. Also check if you feel light headed or near syncope. Your ekg showed normal sinus rhythm today. Stay hydrated. If bp is staying low then might send you to specialist to get opinion if medication to bring bp up. Keep record of bp and bring to follow up.  Dizziness maybe post covid vaccine and maybe bp related?  For dyspnea will get cxr and trial of albuterol. If negative and no response to inhaler then may refer to pulmonologist for PFT since you do have remote hx of covid infection last April.  Prior headache in past ct negative. If persists going forward then refer to Dr. May neurologist.  Follow up in 2 weeks or as needed  30 minutes spent with pt today.  Lucia Gaskins, PA-C

## 2019-05-30 NOTE — Patient Instructions (Addendum)
Your blood pressure is on lower end today. I want you to get blood pressure cuff over the counter and check bp daily. Also check if you feel light headed or near syncope. Your ekg showed normal sinus rhythm today. Stay hydrated. If bp is staying low then might send you to specialist to get opinion if medication to bring bp up. Keep record of bp and bring to follow up.  Dizziness maybe post covid vaccine and maybe bp related?  For dyspnea will get cxr and trial of albuterol. If negative and no response to inhaler then may refer to pulmonologist for PFT since you do have remote hx of covid infection last April.  Prior headache in past ct negative. If persists going forward then refer to Dr. Lucia Gaskins neurologist.  Follow up in 2 weeks or as needed

## 2019-06-06 ENCOUNTER — Ambulatory Visit: Payer: BC Managed Care – PPO | Admitting: Family Medicine

## 2019-06-06 NOTE — Progress Notes (Deleted)
  Virginia Allen - 49 y.o. female MRN [id number]  Date of birth: 1971-02-25  SUBJECTIVE:  Including CC & ROS.  No chief complaint on file.   Virginia Allen is a 49 y.o. female that is  ***.  ***   Review of Systems See HPI   HISTORY: Past Medical, Surgical, Social, and Family History Reviewed & Updated per EMR.   Pertinent Historical Findings include:  No past medical history on file.  Past Surgical History:  Procedure Laterality Date  . EYE SURGERY     For astigmatism     Family History  Problem Relation Age of Onset  . Hypertension Father   . Cancer Father   . Hypertension Mother     Social History   Socioeconomic History  . Marital status: Single    Spouse name: Not on file  . Number of children: Not on file  . Years of education: Not on file  . Highest education level: Not on file  Occupational History  . Not on file  Tobacco Use  . Smoking status: Never Smoker  . Smokeless tobacco: Never Used  Substance and Sexual Activity  . Alcohol use: Never    Comment: rare  . Drug use: Never  . Sexual activity: Yes    Birth control/protection: None  Other Topics Concern  . Not on file  Social History Narrative  . Not on file   Social Determinants of Health   Financial Resource Strain:   . Difficulty of Paying Living Expenses:   Food Insecurity:   . Worried About Programme researcher, broadcasting/film/video in the Last Year:   . Barista in the Last Year:   Transportation Needs:   . Freight forwarder (Medical):   Marland Kitchen Lack of Transportation (Non-Medical):   Physical Activity:   . Days of Exercise per Week:   . Minutes of Exercise per Session:   Stress:   . Feeling of Stress :   Social Connections:   . Frequency of Communication with Friends and Family:   . Frequency of Social Gatherings with Friends and Family:   . Attends Religious Services:   . Active Member of Clubs or Organizations:   . Attends Banker Meetings:   Marland Kitchen Marital Status:     Intimate Partner Violence:   . Fear of Current or Ex-Partner:   . Emotionally Abused:   Marland Kitchen Physically Abused:   . Sexually Abused:      PHYSICAL EXAM:  VS: LMP 05/07/2019 (Approximate)  Physical Exam Gen: NAD, alert, cooperative with exam, well-appearing MSK:  ***      ASSESSMENT & PLAN:   No problem-specific Assessment & Plan notes found for this encounter.

## 2019-08-15 ENCOUNTER — Other Ambulatory Visit: Payer: Self-pay

## 2019-08-15 ENCOUNTER — Encounter: Payer: Self-pay | Admitting: Medical

## 2019-08-15 ENCOUNTER — Ambulatory Visit: Payer: BC Managed Care – PPO | Admitting: Medical

## 2019-08-15 VITALS — BP 118/66 | HR 76 | Resp 18 | Ht 65.0 in | Wt 184.0 lb

## 2019-08-15 DIAGNOSIS — M545 Low back pain, unspecified: Secondary | ICD-10-CM

## 2019-08-15 DIAGNOSIS — L089 Local infection of the skin and subcutaneous tissue, unspecified: Secondary | ICD-10-CM | POA: Diagnosis not present

## 2019-08-15 DIAGNOSIS — L989 Disorder of the skin and subcutaneous tissue, unspecified: Secondary | ICD-10-CM

## 2019-08-15 DIAGNOSIS — E669 Obesity, unspecified: Secondary | ICD-10-CM | POA: Diagnosis not present

## 2019-08-15 DIAGNOSIS — G8929 Other chronic pain: Secondary | ICD-10-CM

## 2019-08-15 MED ORDER — CEPHALEXIN 500 MG PO CAPS: 500.0000 mg | ORAL_CAPSULE | Freq: Two times a day (BID) | ORAL | 0 refills | Status: DC

## 2019-08-15 NOTE — Progress Notes (Signed)
Subjective:    Patient ID: Virginia Allen, female    DOB: 1970-06-30, 49 y.o.   MRN: [id number]  HPI  Pt in for some recent rash around her upper thigh. She states thought maybe secondary to shaving. She states the area is starting to change. The thinks friction and heat is irritating area.  On of area got inflamed and red. She used bandaid over area and bandaid/dressing caused break in skin. Describes area burst.    Pt has hx of tail bone pain. Pt has been seeing specialist for this. Seeing sports med tomorrow.  Pt also reports transient rash/raised area on bridge of nose and rt side of her temporal area. Recently has been present. Prior derm in [place] did various cryofreeze.  Pt also gaining weight. Back pain is barrier to exercise. She is trying.    Review of Systems  Constitutional: Negative for chills, fatigue and fever.  Respiratory: Negative for cough, chest tightness, shortness of breath and wheezing.   Cardiovascular: Negative for chest pain.  Gastrointestinal: Negative for abdominal pain.  Musculoskeletal: Negative for back pain.  Skin: Positive for rash.       See hpi for thighs.    Skin lesions see hpi and exam.  Neurological: Negative for dizziness, numbness and headaches.  Hematological: Negative for adenopathy.  Psychiatric/Behavioral: Negative for behavioral problems.   No past medical history on file.   Social History   Socioeconomic History  . Marital status: Single    Spouse name: Not on file  . Number of children: Not on file  . Years of education: Not on file  . Highest education level: Not on file  Occupational History  . Not on file  Tobacco Use  . Smoking status: Never Smoker  . Smokeless tobacco: Never Used  Substance and Sexual Activity  . Alcohol use: Never    Comment: rare  . Drug use: Never  . Sexual activity: Yes    Birth control/protection: None  Other Topics Concern  . Not on file  Social History Narrative  . Not on  file   Social Determinants of Health   Financial Resource Strain:   . Difficulty of Paying Living Expenses:   Food Insecurity:   . Worried About Programme researcher, broadcasting/film/video in the Last Year:   . Barista in the Last Year:   Transportation Needs:   . Freight forwarder (Medical):   Marland Kitchen Lack of Transportation (Non-Medical):   Physical Activity:   . Days of Exercise per Week:   . Minutes of Exercise per Session:   Stress:   . Feeling of Stress :   Social Connections:   . Frequency of Communication with Friends and Family:   . Frequency of Social Gatherings with Friends and Family:   . Attends Religious Services:   . Active Member of Clubs or Organizations:   . Attends Banker Meetings:   Marland Kitchen Marital Status:   Intimate Partner Violence:   . Fear of Current or Ex-Partner:   . Emotionally Abused:   Marland Kitchen Physically Abused:   . Sexually Abused:     Past Surgical History:  Procedure Laterality Date  . EYE SURGERY     For astigmatism     Family History  Problem Relation Age of Onset  . Hypertension Father   . Cancer Father   . Hypertension Mother     No Known Allergies  Current Outpatient Medications on File Prior to Visit  Medication Sig  Dispense Refill  . albuterol (VENTOLIN HFA) 108 (90 Base) MCG/ACT inhaler Inhale 2 puffs into the lungs every 6 (six) hours as needed. 18 g 0  . Diclofenac Sodium (PENNSAID) 2 % SOLN Place 1 application onto the skin 2 (two) times daily. 112 g 3  . Levonorgestrel (LILETTA, 52 MG, IU) by Intrauterine route.    . meclizine (ANTIVERT) 12.5 MG tablet Take 1 tablet (12.5 mg total) by mouth 3 (three) times daily as needed for dizziness. 30 tablet 0  . predniSONE (DELTASONE) 5 MG tablet Take 6 pills for first day, 5 pills second day, 4 pills third day, 3 pills fourth day, 2 pills the fifth day, and 1 pill sixth day. 21 tablet 0   No current facility-administered medications on file prior to visit.    BP 118/66 (BP Location: Right  Arm, Patient Position: Sitting, Cuff Size: Large)   Pulse 76   Resp 18   Ht 5\' 5"  (1.651 m)   Wt 184 lb (83.5 kg)   SpO2 98%   BMI 30.62 kg/m       Objective:   Physical Exam  General- No acute distress. Pleasant patient. Neck- Full range of motion, no jvd Lungs- Clear, even and unlabored. Heart- regular rate and rhythm. Neurologic- CNII- XII grossly intact. Skin- slight flaky skin bridge of nose and rt cheek. Upper thighs scattered follicle inflamed. Left side one side with open skin(looks like area that was infected and drained)      Assessment & Plan:  For follicles infammed and infected upper thighs recommend keflex, continue to wear silk shorts to decrease friction, advise weight loss and if area persist or worsen notify us.  For skin lesion on face. Refer to derm.  For back pain see sports med MD.  For obesity due recommend weight watchers app.  If not losing wt over next month then refer to weight loss management MD.  Follow up 3 weeks or as needed  Mackie Pai, PA-C   Time spent with patient today was 30  minutes which consisted of chart review, discussing diagnoses,  Treatment, referral, answerig questions and documentation.

## 2019-08-15 NOTE — Patient Instructions (Addendum)
For follicles infamed and infected upper thighs recommend keflex, continue to wear silk shorts to decrease friction, advise weight loss and if area persist or worsen notify us.  For skin lesion on face. Refer to derm.  For back pain see sports med MD.  For obesity due recommend weight watchers app.(counseled on healthy wt loss)  If not losing wt over next month then refer to weight loss management MD.  Follow up 3 weeks or as needed

## 2019-08-16 ENCOUNTER — Encounter: Payer: Self-pay | Admitting: Family Medicine

## 2019-08-16 ENCOUNTER — Ambulatory Visit: Payer: BC Managed Care – PPO | Admitting: Family Medicine

## 2019-08-16 DIAGNOSIS — G8929 Other chronic pain: Secondary | ICD-10-CM

## 2019-08-16 DIAGNOSIS — M533 Sacrococcygeal disorders, not elsewhere classified: Secondary | ICD-10-CM | POA: Diagnosis not present

## 2019-08-16 NOTE — Patient Instructions (Signed)
Good to see you Please try ice  Please try the exercises  Physical therapy will give you a call  Please send me a message in MyChart with any questions or updates.  Please see me back in 4-6 weeks.   --Dr. Jordan Likes

## 2019-08-16 NOTE — Assessment & Plan Note (Signed)
Pain seems more SI joint related as opposed to the coccyx.  Pain is worse with movement and walking.  It is relieved with sitting down.  No radicular symptoms.  Could be lumbar related as well. -Counseled on home exercise therapy and supportive care. -Referral to physical therapy. -Could consider SI joint injection or imaging of the lumbar spine.

## 2019-08-16 NOTE — Progress Notes (Signed)
Virginia Allen - 49 y.o. female MRN [id number]  Date of birth: [birth year]  SUBJECTIVE:  Including CC & ROS.  Chief Complaint  Patient presents with  . Follow-up    Virginia Allen is a 49 y.o. female that is presenting with worsening of her low back pain.  She was previously diagnosed with coccydynia.  This seems less likely as a source of her pain.  She does not have pain with sitting but only when standing.  She has pain with walking around and has to take breaks due to the pain.  It seems localized to the right lower back.  No radicular symptoms.  Seems to be ongoing and still causing severe pain.  She has tried different shoes and insoles.   Review of Systems See HPI   HISTORY: Past Medical, Surgical, Social, and Family History Reviewed & Updated per EMR.   Pertinent Historical Findings include:  No past medical history on file.  Past Surgical History:  Procedure Laterality Date  . EYE SURGERY     For astigmatism     Family History  Problem Relation Age of Onset  . Hypertension Father   . Cancer Father   . Hypertension Mother     Social History   Socioeconomic History  . Marital status: Single    Spouse name: Not on file  . Number of children: Not on file  . Years of education: Not on file  . Highest education level: Not on file  Occupational History  . Not on file  Tobacco Use  . Smoking status: Never Smoker  . Smokeless tobacco: Never Used  Substance and Sexual Activity  . Alcohol use: Never    Comment: rare  . Drug use: Never  . Sexual activity: Yes    Birth control/protection: None  Other Topics Concern  . Not on file  Social History Narrative  . Not on file   Social Determinants of Health   Financial Resource Strain:   . Difficulty of Paying Living Expenses:   Food Insecurity:   . Worried About Programme researcher, broadcasting/film/video in the Last Year:   . Barista in the Last Year:   Transportation Needs:   . Freight forwarder (Medical):    Marland Kitchen Lack of Transportation (Non-Medical):   Physical Activity:   . Days of Exercise per Week:   . Minutes of Exercise per Session:   Stress:   . Feeling of Stress :   Social Connections:   . Frequency of Communication with Friends and Family:   . Frequency of Social Gatherings with Friends and Family:   . Attends Religious Services:   . Active Member of Clubs or Organizations:   . Attends Banker Meetings:   Marland Kitchen Marital Status:   Intimate Partner Violence:   . Fear of Current or Ex-Partner:   . Emotionally Abused:   Marland Kitchen Physically Abused:   . Sexually Abused:      PHYSICAL EXAM:  VS: BP 111/72   Pulse 68   Ht 5\' 4"  (1.626 m)   Wt 189 lb (85.7 kg)   BMI 32.44 kg/m  Physical Exam Gen: NAD, alert, cooperative with exam, well-appearing MSK:  Back/right hip: Tenderness to palpation over the right SI joint. Normal internal and external rotation of the hip. Normal strength resistance with hip flexion. Negative straight leg raise. Neurovascular intact     ASSESSMENT & PLAN:   Chronic right SI joint pain Pain seems more SI joint related  as opposed to the coccyx.  Pain is worse with movement and walking.  It is relieved with sitting down.  No radicular symptoms.  Could be lumbar related as well. -Counseled on home exercise therapy and supportive care. -Referral to physical therapy. -Could consider SI joint injection or imaging of the lumbar spine.

## 2019-08-23 ENCOUNTER — Ambulatory Visit: Payer: BC Managed Care – PPO | Admitting: Physical Therapy

## 2019-08-30 DIAGNOSIS — L821 Other seborrheic keratosis: Secondary | ICD-10-CM | POA: Diagnosis not present

## 2019-08-30 DIAGNOSIS — B081 Molluscum contagiosum: Secondary | ICD-10-CM | POA: Diagnosis not present

## 2019-08-30 DIAGNOSIS — L820 Inflamed seborrheic keratosis: Secondary | ICD-10-CM | POA: Diagnosis not present

## 2019-08-30 DIAGNOSIS — D1801 Hemangioma of skin and subcutaneous tissue: Secondary | ICD-10-CM | POA: Diagnosis not present

## 2019-09-05 ENCOUNTER — Ambulatory Visit: Payer: BC Managed Care – PPO | Admitting: Physical Therapy

## 2019-09-13 ENCOUNTER — Ambulatory Visit: Payer: BC Managed Care – PPO | Admitting: Family Medicine

## 2019-09-27 ENCOUNTER — Ambulatory Visit: Payer: BC Managed Care – PPO

## 2019-10-01 DIAGNOSIS — M545 Low back pain: Secondary | ICD-10-CM | POA: Diagnosis not present

## 2019-10-01 DIAGNOSIS — M25561 Pain in right knee: Secondary | ICD-10-CM | POA: Diagnosis not present

## 2019-10-01 DIAGNOSIS — M5416 Radiculopathy, lumbar region: Secondary | ICD-10-CM | POA: Diagnosis not present

## 2019-10-01 DIAGNOSIS — M25562 Pain in left knee: Secondary | ICD-10-CM | POA: Diagnosis not present

## 2019-10-03 DIAGNOSIS — M25562 Pain in left knee: Secondary | ICD-10-CM | POA: Diagnosis not present

## 2019-10-03 DIAGNOSIS — M545 Low back pain: Secondary | ICD-10-CM | POA: Diagnosis not present

## 2019-10-03 DIAGNOSIS — M25561 Pain in right knee: Secondary | ICD-10-CM | POA: Diagnosis not present

## 2019-10-03 DIAGNOSIS — M5416 Radiculopathy, lumbar region: Secondary | ICD-10-CM | POA: Diagnosis not present

## 2019-10-06 DIAGNOSIS — M25561 Pain in right knee: Secondary | ICD-10-CM | POA: Diagnosis not present

## 2019-10-06 DIAGNOSIS — M5416 Radiculopathy, lumbar region: Secondary | ICD-10-CM | POA: Diagnosis not present

## 2019-10-06 DIAGNOSIS — M25562 Pain in left knee: Secondary | ICD-10-CM | POA: Diagnosis not present

## 2019-10-06 DIAGNOSIS — M545 Low back pain: Secondary | ICD-10-CM | POA: Diagnosis not present

## 2019-10-08 DIAGNOSIS — M25561 Pain in right knee: Secondary | ICD-10-CM | POA: Diagnosis not present

## 2019-10-08 DIAGNOSIS — M25562 Pain in left knee: Secondary | ICD-10-CM | POA: Diagnosis not present

## 2019-10-08 DIAGNOSIS — M545 Low back pain: Secondary | ICD-10-CM | POA: Diagnosis not present

## 2019-10-08 DIAGNOSIS — M5416 Radiculopathy, lumbar region: Secondary | ICD-10-CM | POA: Diagnosis not present

## 2019-10-10 DIAGNOSIS — M25561 Pain in right knee: Secondary | ICD-10-CM | POA: Diagnosis not present

## 2019-10-10 DIAGNOSIS — M545 Low back pain: Secondary | ICD-10-CM | POA: Diagnosis not present

## 2019-10-10 DIAGNOSIS — M5416 Radiculopathy, lumbar region: Secondary | ICD-10-CM | POA: Diagnosis not present

## 2019-10-10 DIAGNOSIS — M25562 Pain in left knee: Secondary | ICD-10-CM | POA: Diagnosis not present

## 2019-10-13 DIAGNOSIS — M5416 Radiculopathy, lumbar region: Secondary | ICD-10-CM | POA: Diagnosis not present

## 2019-10-13 DIAGNOSIS — M545 Low back pain: Secondary | ICD-10-CM | POA: Diagnosis not present

## 2019-10-13 DIAGNOSIS — M25562 Pain in left knee: Secondary | ICD-10-CM | POA: Diagnosis not present

## 2019-10-13 DIAGNOSIS — M25561 Pain in right knee: Secondary | ICD-10-CM | POA: Diagnosis not present

## 2019-10-15 DIAGNOSIS — M25562 Pain in left knee: Secondary | ICD-10-CM | POA: Diagnosis not present

## 2019-10-15 DIAGNOSIS — M25561 Pain in right knee: Secondary | ICD-10-CM | POA: Diagnosis not present

## 2019-10-15 DIAGNOSIS — M5416 Radiculopathy, lumbar region: Secondary | ICD-10-CM | POA: Diagnosis not present

## 2019-10-15 DIAGNOSIS — M545 Low back pain: Secondary | ICD-10-CM | POA: Diagnosis not present

## 2019-10-17 DIAGNOSIS — M545 Low back pain: Secondary | ICD-10-CM | POA: Diagnosis not present

## 2019-10-17 DIAGNOSIS — M5416 Radiculopathy, lumbar region: Secondary | ICD-10-CM | POA: Diagnosis not present

## 2019-10-17 DIAGNOSIS — M25561 Pain in right knee: Secondary | ICD-10-CM | POA: Diagnosis not present

## 2019-10-17 DIAGNOSIS — M25562 Pain in left knee: Secondary | ICD-10-CM | POA: Diagnosis not present

## 2019-10-18 ENCOUNTER — Ambulatory Visit: Payer: BC Managed Care – PPO | Admitting: Family Medicine

## 2019-10-20 DIAGNOSIS — M545 Low back pain: Secondary | ICD-10-CM | POA: Diagnosis not present

## 2019-10-20 DIAGNOSIS — M25562 Pain in left knee: Secondary | ICD-10-CM | POA: Diagnosis not present

## 2019-10-20 DIAGNOSIS — M25561 Pain in right knee: Secondary | ICD-10-CM | POA: Diagnosis not present

## 2019-10-20 DIAGNOSIS — M5416 Radiculopathy, lumbar region: Secondary | ICD-10-CM | POA: Diagnosis not present

## 2019-10-22 DIAGNOSIS — M5416 Radiculopathy, lumbar region: Secondary | ICD-10-CM | POA: Diagnosis not present

## 2019-10-22 DIAGNOSIS — M25561 Pain in right knee: Secondary | ICD-10-CM | POA: Diagnosis not present

## 2019-10-22 DIAGNOSIS — M25562 Pain in left knee: Secondary | ICD-10-CM | POA: Diagnosis not present

## 2019-10-22 DIAGNOSIS — M545 Low back pain: Secondary | ICD-10-CM | POA: Diagnosis not present

## 2019-10-24 DIAGNOSIS — M5416 Radiculopathy, lumbar region: Secondary | ICD-10-CM | POA: Diagnosis not present

## 2019-10-24 DIAGNOSIS — M25562 Pain in left knee: Secondary | ICD-10-CM | POA: Diagnosis not present

## 2019-10-24 DIAGNOSIS — M25561 Pain in right knee: Secondary | ICD-10-CM | POA: Diagnosis not present

## 2019-10-24 DIAGNOSIS — M545 Low back pain: Secondary | ICD-10-CM | POA: Diagnosis not present

## 2019-10-27 DIAGNOSIS — M25561 Pain in right knee: Secondary | ICD-10-CM | POA: Diagnosis not present

## 2019-10-27 DIAGNOSIS — M545 Low back pain: Secondary | ICD-10-CM | POA: Diagnosis not present

## 2019-10-27 DIAGNOSIS — M25562 Pain in left knee: Secondary | ICD-10-CM | POA: Diagnosis not present

## 2019-10-27 DIAGNOSIS — M5416 Radiculopathy, lumbar region: Secondary | ICD-10-CM | POA: Diagnosis not present

## 2019-11-01 ENCOUNTER — Encounter: Payer: Self-pay | Admitting: Physical Therapy

## 2019-11-01 ENCOUNTER — Other Ambulatory Visit: Payer: Self-pay

## 2019-11-01 ENCOUNTER — Ambulatory Visit: Payer: BC Managed Care – PPO | Attending: Family Medicine | Admitting: Physical Therapy

## 2019-11-01 ENCOUNTER — Ambulatory Visit: Payer: BC Managed Care – PPO | Admitting: Family Medicine

## 2019-11-01 DIAGNOSIS — M62838 Other muscle spasm: Secondary | ICD-10-CM | POA: Insufficient documentation

## 2019-11-01 DIAGNOSIS — G8929 Other chronic pain: Secondary | ICD-10-CM | POA: Diagnosis not present

## 2019-11-01 DIAGNOSIS — M545 Low back pain: Secondary | ICD-10-CM | POA: Diagnosis not present

## 2019-11-01 DIAGNOSIS — M6281 Muscle weakness (generalized): Secondary | ICD-10-CM | POA: Diagnosis not present

## 2019-11-01 NOTE — Therapy (Signed)
[facility] [address], [address] [place], [place], [address] Phone: [phone]   Fax:  [phone]  Physical Therapy Evaluation  Patient Details  Name: Virginia Allen MRN: [id number] Date of Birth: [birth year] Referring Provider (PT): Clare Gandy, MD   Encounter Date: 11/01/2019   PT End of Session - 11/01/19 1654    Visit Number 1    Date for PT Re-Evaluation 12/13/19    Authorization Type [employer] PPO    Authorization Time Period 11/01/19 to 12/13/19    PT Start Time 1632   pt arrived late   PT Stop Time 1659    PT Time Calculation (min) 27 min    Activity Tolerance Patient limited by pain    Behavior During Therapy [facility] for tasks assessed/performed           History reviewed. No pertinent past medical history.  Past Surgical History:  Procedure Laterality Date  . EYE SURGERY     For astigmatism     There were no vitals filed for this visit.    Subjective Assessment - 11/01/19 1631    Subjective Pt states that she began having pain in the center of her pelvis. She got an xray and was told that her tailbone had moved. She denies any falls. Her pain has increased, in that she is unable to walk or be on her feet for longer than 3 minutes. She states her knees are also hurting and she feels unsteady when walking.    Limitations Sitting;Standing;House hold activities;Walking    How long can you walk comfortably? no more than 1 block    Patient Stated Goals improve pain with walking    Currently in Pain? Yes    Pain Location Sacrum    Pain Orientation Posterior    Pain Descriptors / Indicators Aching    Pain Type Chronic pain    Pain Radiating Towards none    Pain Onset More than a month ago    Pain Frequency Constant    Aggravating Factors  stairs, walking, standing-sometimes sitting    Effect of Pain on Daily Activities lives on the 3rd floor              [facility] PT Assessment - 11/01/19 0001       Assessment   Medical Diagnosis Chronic Rt SI joint pain     Referring Provider (PT) Clare Gandy, MD    Next MD Visit not yet     Prior Therapy none       Precautions   Precautions None      Restrictions   Weight Bearing Restrictions No      Balance Screen   Has the patient fallen in the past 6 months No    Has the patient had a decrease in activity level because of a fear of falling?  No    Is the patient reluctant to leave their home because of a fear of falling?  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      Cognition   Overall Cognitive Status Within Functional Limits for tasks assessed      Sensation   Additional Comments denies numbness/tingling       ROM / Strength   AROM / PROM / Strength Strength      Strength   Overall Strength Comments Rt hip IR/ER 3/5 MMT, Rt/Lt hip extension 3+/5 MMT, hip abduction 4/5 MMT  Palpation   Palpation comment tenderness Rt SI joint, Rt L5/S1, Rt obturators       Transfers   Five time sit to stand comments  33 sec      Ambulation/Gait   Pre-Gait Activities ascends and descends steps sideways with Rt step to pattern     Gait Comments minimal hip extension and trunk rotation noted, antalgic pattern                      Objective measurements completed on examination: See above findings.       [facility] Adult PT Treatment/Exercise - 11/01/19 0001      Exercises   Exercises Lumbar      Lumbar Exercises: Stretches   Other Lumbar Stretch Exercise quadruped hip IR rock back stretch 2x10 sec     Other Lumbar Stretch Exercise reviewed HEP                  PT Education - 11/01/19 1731    Education Details POC; HEP    Person(s) Educated Patient    Methods Explanation;Handout    Comprehension Verbalized understanding;Returned demonstration            PT Short Term Goals - 11/01/19 1724      PT SHORT TERM GOAL #1   Title Pt will be independent with her initial  HEP to improve pain and LE strength.    Time 6    Period Weeks    Status New             PT Long Term Goals - 11/01/19 1725      PT LONG TERM GOAL #1   Title Pt will have improved hip strength to atleast 4+/5 MMT which will improve her efficiency with daily activity.    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    Time 6    Period Weeks    Status New      PT LONG TERM GOAL #3   Title Pt will be able to complete 5x sit to stand in less than 20 sec without the need for UE support to reflect improvements in pain and strength with transitions.    Time 6    Period Weeks    Status New      PT LONG TERM GOAL #4   Title Pt wil be able to ascend and descend clinic steps with reciprocal pattern x3 trials, with no more than 3/10 discomfort.    Time 6    Period Weeks    Status New      PT LONG TERM GOAL #5   Title Pt will report atleast 60% improvement in her pain from the start of PT which will improve her quality of life.    Time 6    Period Weeks    Status New                  Plan - 11/01/19 1719    Clinical Impression Statement Pt is a 49y.o F referred to OPPT with complaints of Rt sacral/SI joint pain. Her pain was onset insidiously at the start of this year and has become increasingly worse. She is limited with standing, walking, laying on  her back and going up/downstairs. She has significant weakness of bilateral gluteals and tenderness of hip rotators on the Rt. Pt has limited hip extension during gait and negotiates stairs with a step to pattern. 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    Personal  Factors and Comorbidities Time since onset of injury/illness/exacerbation;Age;Fitness    Examination-Activity Limitations Sit;Stand;Locomotion Level;Bend;Squat;Transfers;Stairs    Examination-Participation Restrictions Occupation    Stability/Clinical Decision Making Evolving/Moderate complexity    Clinical Decision Making Moderate    Rehab Potential Good    PT Frequency 2x / week    PT Duration 6 weeks    PT Treatment/Interventions ADLs/Self Care Home Management;Aquatic Therapy;Cryotherapy;Electrical Stimulation;Moist Heat;Stair training;Gait training;Functional mobility training;Therapeutic activities;Balance training;Therapeutic exercise;Neuromuscular re-education;Manual techniques;Patient/family education;Passive range of motion;Dry needling;Spinal Manipulations;Taping    PT Next Visit Plan glute strengthening; deep hip rotator stretches    PT Home Exercise Plan Access Code: [email or web address]    Consulted and Agree with Plan of Care Patient           Patient will benefit from skilled therapeutic intervention in order to improve the following deficits and impairments:  Abnormal gait, Decreased strength, Difficulty walking, Decreased activity tolerance, Hypomobility, Impaired flexibility, Increased muscle spasms, Postural dysfunction, Pain, Improper body mechanics  Visit Diagnosis: Chronic low back pain, unspecified back pain laterality, unspecified whether sciatica present  Other muscle spasm  Muscle weakness (generalized)     Problem List Patient Active Problem List   Diagnosis Date Noted  . 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 Coccydynia 05/07/2019   5:33 PM,11/01/19 Donita Brooks PT, DPT [facility]  [phone]  [facility] [address] [place], [place], [address] Phone: [phone]   Fax:  [phone]  Name: Virginia Allen MRN: [id number] Date of Birth:  1970/05/14

## 2019-11-01 NOTE — Patient Instructions (Signed)
Access Code: [email or web address] URL: [email or web address] Date: 11/01/2019 Prepared by: [facility]  Exercises Quadruped Knee Flexion Stretch - 2 x daily - 7 x weekly - 10 reps - 10 sec hold Supine Bilateral Hip Internal Rotation Stretch - 2 x daily - 7 x weekly - 10 reps - 10 sec hold   [facility] [address], [address] [place], [place] [address] Phone # [phone] Fax [phone]

## 2019-11-06 ENCOUNTER — Other Ambulatory Visit: Payer: Self-pay

## 2019-11-06 ENCOUNTER — Ambulatory Visit: Payer: BC Managed Care – PPO | Admitting: Physical Therapy

## 2019-11-06 ENCOUNTER — Encounter: Payer: Self-pay | Admitting: Physical Therapy

## 2019-11-06 DIAGNOSIS — M6281 Muscle weakness (generalized): Secondary | ICD-10-CM | POA: Diagnosis not present

## 2019-11-06 DIAGNOSIS — M545 Low back pain, unspecified: Secondary | ICD-10-CM

## 2019-11-06 DIAGNOSIS — M62838 Other muscle spasm: Secondary | ICD-10-CM

## 2019-11-06 DIAGNOSIS — G8929 Other chronic pain: Secondary | ICD-10-CM

## 2019-11-06 NOTE — Therapy (Signed)
[facility] [address], [address] [place], [place], [address] Phone: [phone]   Fax:  [phone]  Physical Therapy Treatment  Patient Details  Name: Virginia Allen MRN: [id number] Date of Birth: [birth year] Referring Provider (PT): Clare Gandy, MD   Encounter Date: 11/06/2019   PT End of Session - 11/06/19 1530    Visit Number 2    Date for PT Re-Evaluation 12/13/19    Authorization Type BCBS PPO    Authorization Time Period 11/01/19 to 12/13/19    PT Start Time 1446    PT Stop Time 1529    PT Time Calculation (min) 43 min    Activity Tolerance Patient limited by pain;No increased pain    Behavior During Therapy [facility] for tasks assessed/performed           History reviewed. No pertinent past medical history.  Past Surgical History:  Procedure Laterality Date  . EYE SURGERY     For astigmatism     There were no vitals filed for this visit.   Subjective Assessment - 11/06/19 1452    Subjective Pt states that things are going ok. She tried her HEP twice since her evaluation. Pain is still there today.    Limitations Sitting;Standing;House hold activities;Walking    How long can you walk comfortably? no more than 1 block    Patient Stated Goals improve pain with walking    Currently in Pain? Yes    Pain Score 8     Pain Location Sacrum    Pain Orientation Right;Left    Pain Descriptors / Indicators Sore;Sharp    Pain Type Chronic pain    Pain Radiating Towards none    Pain Onset More than a month ago    Pain Frequency Intermittent    Aggravating Factors  walking, standing                             [facility] Adult PT Treatment/Exercise - 11/06/19 0001      Lumbar Exercises: Stretches   Hip Flexor Stretch Left;Right;5 reps;10 seconds    Hip Flexor Stretch Limitations hip IR with LE in chair     Pelvic Tilt 10 reps;5 seconds    Piriformis Stretch 2 reps;Left;Right;10 seconds     Piriformis Stretch Limitations supine knee to opposite shoulder     Figure 4 Stretch 2 reps;10 seconds    Figure 4 Stretch Limitations supine     Other Lumbar Stretch Exercise long sitting hip 90/90 IR and ER x10 reps       Lumbar Exercises: Supine   Clam 10 reps    Clam Limitations single leg red TB-abdominal bracing to avoid trunk rotation     Bridge 10 reps    Other Supine Lumbar Exercises hip extension isometric into mat table 5x2 sec hold                     PT Short Term Goals - 11/01/19 1724      PT SHORT TERM GOAL #1   Title Pt will be independent with her initial HEP to improve pain and LE strength.    Time 6    Period Weeks    Status New             PT Long Term Goals - 11/01/19 1725      PT LONG TERM GOAL #1   Title Pt will have improved hip  strength to atleast 4+/5 MMT which will improve her efficiency with daily activity.    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    Time 6    Period Weeks    Status New      PT LONG TERM GOAL #3   Title Pt will be able to complete 5x sit to stand in less than 20 sec without the need for UE support to reflect improvements in pain and strength with transitions.    Time 6    Period Weeks    Status New      PT LONG TERM GOAL #4   Title Pt wil be able to ascend and descend clinic steps with reciprocal pattern x3 trials, with no more than 3/10 discomfort.    Time 6    Period Weeks    Status New      PT LONG TERM GOAL #5   Title Pt will report atleast 60% improvement in her pain from the start of PT which will improve her quality of life.    Time 6    Period Weeks    Status New                 Plan - 11/06/19 1530    Clinical Impression Statement Pt arrived with less pain than at her evaluation, but she rated it 8/10 at the start  of her session. Pt was able to complete therex to increase hip rotation and glute activation. Pt required several modifications to exercise to prevent pain, particularly with hip extension in deeper ranges of hip flexion. Pt has noted hip weakness with a positive Trendelenburg. Pt was encouraged to ambulate with increased gait speed, and this resulted in less pain from the start of the session. She demonstrated good understanding of HEP updates.    Personal Factors and Comorbidities Time since onset of injury/illness/exacerbation;Age;Fitness    Examination-Activity Limitations Sit;Stand;Locomotion Level;Bend;Squat;Transfers;Stairs    Examination-Participation Restrictions Occupation    Stability/Clinical Decision Making Evolving/Moderate complexity    Rehab Potential Good    PT Frequency 2x / week    PT Duration 6 weeks    PT Treatment/Interventions ADLs/Self Care Home Management;Aquatic Therapy;Cryotherapy;Electrical Stimulation;Moist Heat;Stair training;Gait training;Functional mobility training;Therapeutic activities;Balance training;Therapeutic exercise;Neuromuscular re-education;Manual techniques;Patient/family education;Passive range of motion;Dry needling;Spinal Manipulations;Taping    PT Next Visit Plan glute strengthening in more neutral position progressing to deeper ranges as able; deep hip rotator stretches    PT Home Exercise Plan Access Code: [email or web address]    Consulted and Agree with Plan of Care Patient           Patient will benefit from skilled therapeutic intervention in order to improve the following deficits and impairments:  Abnormal gait, Decreased strength, Difficulty walking, Decreased activity tolerance, Hypomobility, Impaired flexibility, Increased muscle spasms, Postural dysfunction, Pain, Improper body mechanics  Visit Diagnosis: Chronic low back pain, unspecified back pain laterality, unspecified whether sciatica present  Other muscle spasm  Muscle weakness  (generalized)     Problem List Patient Active Problem List   Diagnosis Date Noted  . 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 Coccydynia 05/07/2019    5:18 PM,11/06/19 Donita Brooks PT, DPT [facility]  [phone]  [facility] [address], [address] [place], [place], [address] Phone: [phone]   Fax:  [phone]  Name: Allisa Einspahr MRN: [id number] Date of Birth: 02/28/1971

## 2019-11-10 DIAGNOSIS — M25561 Pain in right knee: Secondary | ICD-10-CM | POA: Diagnosis not present

## 2019-11-10 DIAGNOSIS — M5416 Radiculopathy, lumbar region: Secondary | ICD-10-CM | POA: Diagnosis not present

## 2019-11-10 DIAGNOSIS — M545 Low back pain: Secondary | ICD-10-CM | POA: Diagnosis not present

## 2019-11-10 DIAGNOSIS — M25562 Pain in left knee: Secondary | ICD-10-CM | POA: Diagnosis not present

## 2019-11-12 ENCOUNTER — Encounter: Payer: Self-pay | Admitting: Physical Therapy

## 2019-11-12 ENCOUNTER — Ambulatory Visit: Payer: BC Managed Care – PPO | Admitting: Physical Therapy

## 2019-11-12 ENCOUNTER — Other Ambulatory Visit: Payer: Self-pay

## 2019-11-12 DIAGNOSIS — G8929 Other chronic pain: Secondary | ICD-10-CM | POA: Diagnosis not present

## 2019-11-12 DIAGNOSIS — M62838 Other muscle spasm: Secondary | ICD-10-CM | POA: Diagnosis not present

## 2019-11-12 DIAGNOSIS — M6281 Muscle weakness (generalized): Secondary | ICD-10-CM | POA: Diagnosis not present

## 2019-11-12 DIAGNOSIS — M545 Low back pain, unspecified: Secondary | ICD-10-CM

## 2019-11-12 NOTE — Therapy (Signed)
[facility] [address] [place], [place], [address] Phone: [phone]   Fax:  [phone]  Physical Therapy Treatment  Patient Details  Name: Virginia Allen MRN: [id number] Date of Birth: April 17, 1970 Referring Provider (PT): Clare Gandy, MD   Encounter Date: 11/12/2019   PT End of Session - 11/12/19 1542    Visit Number 3    Date for PT Re-Evaluation 12/13/19    Authorization Type BCBS PPO    Authorization Time Period 11/01/19 to 12/13/19    PT Start Time 1540   10 min late   PT Stop Time 1615    PT Time Calculation (min) 35 min    Activity Tolerance Patient tolerated treatment well    Behavior During Therapy [facility] for tasks assessed/performed           History reviewed. No pertinent past medical history.  Past Surgical History:  Procedure Laterality Date  . EYE SURGERY     For astigmatism     There were no vitals filed for this visit.   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    Currently in Pain? Yes    Pain Score 7     Pain Location Back    Pain Orientation Left;Right;Lower    Pain Descriptors / Indicators Sore                             [facility] Adult PT Treatment/Exercise - 11/12/19 0001      Lumbar Exercises: Stretches   Lower Trunk Rotation --   10x, then 10 sec hold 2x bil   Hip Flexor Stretch Left;Right;5 reps;10 seconds   in supine   Pelvic Tilt --   Attempted but pt could not move her pelvis in supine, TC    Piriformis Stretch Right;Left;3 reps;10 seconds    Piriformis Stretch Limitations supine knee to opposite shoulder     Figure 4 Stretch Limitations supine, VC for technique       Lumbar Exercises: Aerobic   Nustep L1x 5 min PTA discussed status with pt      Lumbar Exercises: Supine   Clam --   10x Bil, 10x Unilat blue loop    Bridge 10 reps;3 seconds                    PT Short Term Goals - 11/01/19 1724      PT SHORT TERM GOAL #1   Title Pt will be independent with her initial HEP to improve pain and LE strength.    Time 6    Period Weeks    Status New             PT Long Term Goals - 11/01/19 1725      PT LONG TERM GOAL #1   Title Pt will have improved hip strength to atleast 4+/5 MMT which will improve her efficiency with daily activity.    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    Time 6  Period Weeks    Status New      PT LONG TERM GOAL #3   Title Pt will be able to complete 5x sit to stand in less than 20 sec without the need for UE support to reflect improvements in pain and strength with transitions.    Time 6    Period Weeks    Status New      PT LONG TERM GOAL #4   Title Pt wil be able to ascend and descend clinic steps with reciprocal pattern x3 trials, with no more than 3/10 discomfort.    Time 6    Period Weeks    Status New      PT LONG TERM GOAL #5   Title Pt will report atleast 60% improvement in her pain from the start of PT which will improve her quality of life.    Time 6    Period Weeks    Status New                 Plan - 11/12/19 1543    Clinical Impression Statement Pt arrives with "a sore back, from returning to school today." Pt reports doing more standing definitely made her more sore. Pt presented with super tight hip flexors today, and could not get her pelvis moving to perform the pelvic tilt exercise. Pt demonstrates good core stability with her supine resistive hip movements, only minor verbal cuing with unilateral clamshell to get opposite abdominals contacting. No increased pain, slightly less pain overall at end of session.    Personal Factors and Comorbidities  Time since onset of injury/illness/exacerbation;Age;Fitness    Examination-Activity Limitations Sit;Stand;Locomotion Level;Bend;Squat;Transfers;Stairs    Examination-Participation Restrictions Occupation    Stability/Clinical Decision Making Evolving/Moderate complexity    Rehab Potential Good    PT Frequency 2x / week    PT Duration 6 weeks    PT Treatment/Interventions ADLs/Self Care Home Management;Aquatic Therapy;Cryotherapy;Electrical Stimulation;Moist Heat;Stair training;Gait training;Functional mobility training;Therapeutic activities;Balance training;Therapeutic exercise;Neuromuscular re-education;Manual techniques;Patient/family education;Passive range of motion;Dry needling;Spinal Manipulations;Taping    PT Next Visit Plan glute strengthening in more neutral position progressing to deeper ranges as able; deep hip rotator stretches    PT Home Exercise Plan Access Code: [email or web address]    Consulted and Agree with Plan of Care Patient           Patient will benefit from skilled therapeutic intervention in order to improve the following deficits and impairments:  Abnormal gait, Decreased strength, Difficulty walking, Decreased activity tolerance, Hypomobility, Impaired flexibility, Increased muscle spasms, Postural dysfunction, Pain, Improper body mechanics  Visit Diagnosis: Chronic low back pain, unspecified back pain laterality, unspecified whether sciatica present  Other muscle spasm  Muscle weakness (generalized)     Problem List Patient Active Problem List   Diagnosis Date Noted  . 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 Coccydynia 05/07/2019    Carlea Badour, PTA 11/12/2019, 4:10 PM  [facility] [address], [address] [place], [place], [address] Phone: [phone]   Fax:  [phone]  Name: Antonisha Waskey MRN: [id number] Date of Birth: 1970/05/27

## 2019-11-20 ENCOUNTER — Other Ambulatory Visit: Payer: Self-pay

## 2019-11-20 ENCOUNTER — Ambulatory Visit: Payer: BC Managed Care – PPO | Admitting: Physical Therapy

## 2019-11-20 ENCOUNTER — Encounter: Payer: Self-pay | Admitting: Physical Therapy

## 2019-11-20 DIAGNOSIS — M545 Low back pain, unspecified: Secondary | ICD-10-CM

## 2019-11-20 DIAGNOSIS — G8929 Other chronic pain: Secondary | ICD-10-CM

## 2019-11-20 DIAGNOSIS — M62838 Other muscle spasm: Secondary | ICD-10-CM | POA: Diagnosis not present

## 2019-11-20 DIAGNOSIS — M6281 Muscle weakness (generalized): Secondary | ICD-10-CM | POA: Diagnosis not present

## 2019-11-20 NOTE — Therapy (Signed)
[facility] [address] [place], [place], [address] Phone: [phone]   Fax:  [phone]  Physical Therapy Treatment  Patient Details  Name: Virginia Allen MRN: [id number] Date of Birth: 05-Oct-1970 Referring Provider (PT): Clare Gandy, MD   Encounter Date: 11/20/2019   PT End of Session - 11/20/19 1612    Visit Number 4    Date for PT Re-Evaluation 12/13/19    Authorization Type BCBS PPO    Authorization Time Period 11/01/19 to 12/13/19    PT Start Time 1530    PT Stop Time 1612    PT Time Calculation (min) 42 min    Activity Tolerance Patient tolerated treatment well;No increased pain    Behavior During Therapy [facility] for tasks assessed/performed           History reviewed. No pertinent past medical history.  Past Surgical History:  Procedure Laterality Date  . EYE SURGERY     For astigmatism     There were no vitals filed for this visit.   Subjective Assessment - 11/20/19 1534    Subjective Pt states that her pain is improved. She is walking faster and her pain is not as intense by the end of the day. Just mild discomfort right now.    Currently in Pain? Yes    Pain Score 6     Pain Location Sacrum    Pain Orientation Posterior    Pain Descriptors / Indicators Aching;Dull    Pain Type Acute pain    Aggravating Factors  movement, it changes              [facility] PT Assessment - 11/20/19 0001      Transfers   Five time sit to stand comments  27 sec, felt more difficult after the 2nd rep                          [facility] Adult PT Treatment/Exercise - 11/20/19 0001      Lumbar Exercises: Stretches   Figure 4 Stretch 4 reps;20 seconds    Figure 4 Stretch Limitations supine PT assist       Lumbar Exercises: Machines for Strengthening   Leg Press seat 7: BLE #80 2x10 reps       Lumbar Exercises: Standing   Other Standing Lumbar Exercises hip extension at an angle 2x10 reps  with xlight band around ankles       Lumbar Exercises: Seated   Sit to Stand 5 reps    Sit to Stand Limitations x2 sets, 1st with blue TB underneath feet, 2nd with #10 kettlebell     Other Seated Lumbar Exercises long sitting 90/90 hip IR/ER x10 reps       Lumbar Exercises: Supine   Pelvic Tilt 10 reps;5 seconds    Bridge 10 reps    Bridge Limitations LE on 8" box       Lumbar Exercises: Sidelying   Hip Abduction 10 reps;Both                  PT Education - 11/20/19 1609    Education Details technique with therex    Person(s) Educated Patient    Methods Explanation;Verbal cues;Tactile cues    Comprehension Verbalized understanding;Returned demonstration            PT Short Term Goals - 11/01/19 1724      PT SHORT TERM GOAL #1   Title Pt will  be independent with her initial HEP to improve pain and LE strength.    Time 6    Period Weeks    Status New             PT Long Term Goals - 11/01/19 1725      PT LONG TERM GOAL #1   Title Pt will have improved hip strength to atleast 4+/5 MMT which will improve her efficiency with daily activity.    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    Time 6    Period Weeks    Status New      PT LONG TERM GOAL #3   Title Pt will be able to complete 5x sit to stand in less than 20 sec without the need for UE support to reflect improvements in pain and strength with transitions.    Time 6    Period Weeks    Status New      PT LONG TERM GOAL #4   Title Pt wil be able to ascend and descend clinic steps with reciprocal pattern x3 trials, with no more than 3/10 discomfort.    Time 6    Period Weeks    Status New      PT LONG TERM GOAL #5   Title Pt will report atleast 60% improvement in her pain from the start of PT which will improve her quality  of life.    Time 6    Period Weeks    Status New                 Plan - 11/20/19 1613    Clinical Impression Statement Pt is making progress towards her goals. She finds that her pain is less intense by the end of a workday and she is walking faster than at her evaluation. Pt was able to complete 5x sit to stand with 6 second improvement. Pt demonstrates poor glute activation with most of today's exercises, but this was improved with tactile and verbal cuing. PT reported improved sacral pain end of session.    Personal Factors and Comorbidities Time since onset of injury/illness/exacerbation;Age;Fitness    Examination-Activity Limitations Sit;Stand;Locomotion Level;Bend;Squat;Transfers;Stairs    Examination-Participation Restrictions Occupation    Stability/Clinical Decision Making Evolving/Moderate complexity    Rehab Potential Good    PT Frequency 2x / week    PT Duration 6 weeks    PT Treatment/Interventions ADLs/Self Care Home Management;Aquatic Therapy;Cryotherapy;Electrical Stimulation;Moist Heat;Stair training;Gait training;Functional mobility training;Therapeutic activities;Balance training;Therapeutic exercise;Neuromuscular re-education;Manual techniques;Patient/family education;Passive range of motion;Dry needling;Spinal Manipulations;Taping    PT Next Visit Plan glute strengthening in more neutral position progressing to deeper ranges as able; deep hip rotator stretches    PT Home Exercise Plan Access Code: [email or web address]    Consulted and Agree with Plan of Care Patient           Patient will benefit from skilled therapeutic intervention in order to improve the following deficits and impairments:  Abnormal gait, Decreased strength, Difficulty walking, Decreased activity tolerance, Hypomobility, Impaired flexibility, Increased muscle spasms, Postural dysfunction, Pain, Improper body mechanics  Visit Diagnosis: Chronic low back pain, unspecified back pain laterality, unspecified  whether sciatica present  Other muscle spasm  Muscle weakness (generalized)  Problem List Patient Active Problem List   Diagnosis Date Noted  . 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 Coccydynia 05/07/2019    5:10 PM,11/20/19 Donita Brooks PT, DPT [facility]  [phone]  [facility] [address] [place], [place], [address] Phone: [phone]   Fax:  [phone]  Name: Virginia Allen MRN: [id number] Date of Birth: 06/25/1970

## 2019-11-24 DIAGNOSIS — M25562 Pain in left knee: Secondary | ICD-10-CM | POA: Diagnosis not present

## 2019-11-24 DIAGNOSIS — M545 Low back pain: Secondary | ICD-10-CM | POA: Diagnosis not present

## 2019-11-24 DIAGNOSIS — M5416 Radiculopathy, lumbar region: Secondary | ICD-10-CM | POA: Diagnosis not present

## 2019-11-24 DIAGNOSIS — M25561 Pain in right knee: Secondary | ICD-10-CM | POA: Diagnosis not present

## 2019-11-27 ENCOUNTER — Other Ambulatory Visit: Payer: Self-pay

## 2019-11-27 ENCOUNTER — Encounter: Payer: Self-pay | Admitting: Physical Therapy

## 2019-11-27 ENCOUNTER — Ambulatory Visit: Payer: BC Managed Care – PPO | Attending: Family Medicine | Admitting: Physical Therapy

## 2019-11-27 DIAGNOSIS — M6281 Muscle weakness (generalized): Secondary | ICD-10-CM | POA: Insufficient documentation

## 2019-11-27 DIAGNOSIS — M545 Low back pain: Secondary | ICD-10-CM | POA: Diagnosis not present

## 2019-11-27 DIAGNOSIS — G8929 Other chronic pain: Secondary | ICD-10-CM | POA: Insufficient documentation

## 2019-11-27 DIAGNOSIS — M62838 Other muscle spasm: Secondary | ICD-10-CM | POA: Insufficient documentation

## 2019-11-27 NOTE — Therapy (Signed)
[facility] [address] [place], [place], [address] Phone: [phone]   Fax:  [phone]  Physical Therapy Treatment  Patient Details  Name: Virginia Allen MRN: [id number] Date of Birth: Jun 13, 1970 Referring Provider (PT): Clare Gandy, MD   Encounter Date: 11/27/2019   PT End of Session - 11/27/19 1631    Visit Number 5    Date for PT Re-Evaluation 12/13/19    Authorization Type BCBS PPO    Authorization Time Period 11/01/19 to 12/13/19    PT Start Time 1535    PT Stop Time 1615    PT Time Calculation (min) 40 min    Activity Tolerance Patient tolerated treatment well;No increased pain    Behavior During Therapy [facility] for tasks assessed/performed           History reviewed. No pertinent past medical history.  Past Surgical History:  Procedure Laterality Date  . EYE SURGERY     For astigmatism     There were no vitals filed for this visit.   Subjective Assessment - 11/27/19 1536    Subjective Pt went up to [place] over the weekend. She did ok with the ride up there but the next day she was walking bent over. Overall, she did well with this. 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 She has random "shocks" when she is sitting or standing and going to turn/pivot.    Currently in Pain? No/denies                             [facility] Adult PT Treatment/Exercise - 11/27/19 0001      Lumbar Exercises: Machines for Strengthening   Leg Press seat 7: BLE #90 2x10 reps       Lumbar Exercises: Standing   Other Standing Lumbar Exercises hip abduction/extension on slider x5 reps each LE       Lumbar Exercises: Seated   Other Seated Lumbar Exercises Lt and Rt hip IR/ER x10 reps      Lumbar Exercises: Supine   Ab Set 15 reps;2 seconds    AB Set Limitations heavy PT cuing to avoid rib flaring or bearing down     Bridge 5 reps    Bridge Limitations  green TB around knees: x3 sets       Manual Therapy   Manual Therapy Taping    Manual therapy comments kinesiotape: Start taping over sacrum                   PT Education - 11/27/19 1634    Education Details kinesiotape implications and wear time    Person(s) Educated Patient    Methods Explanation    Comprehension Verbalized understanding            PT Short Term Goals - 11/01/19 1724      PT SHORT TERM GOAL #1   Title Pt will be independent with her initial HEP to improve pain and LE strength.    Time 6    Period Weeks    Status New             PT Long Term Goals - 11/01/19 1725      PT LONG TERM GOAL #1   Title Pt will have improved hip strength to atleast 4+/5 MMT which will improve her efficiency with daily activity.    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    Time 6    Period Weeks    Status New      PT LONG TERM GOAL #3   Title Pt will be able to complete 5x sit to stand in less than 20 sec without the need for UE support to reflect improvements in pain and strength with transitions.    Time 6    Period Weeks    Status New      PT LONG TERM GOAL #4   Title Pt wil be able to ascend and descend clinic steps with reciprocal pattern x3 trials, with no more than 3/10 discomfort.    Time 6    Period Weeks    Status New      PT LONG TERM GOAL #5   Title Pt will report atleast 60% improvement in her pain from the start of PT which will improve her quality of life.    Time 6    Period Weeks    Status New                 Plan - 11/27/19 1631    Clinical Impression Statement Pt's standing tolerance has improved since starting PT. 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 Pt has significant Trendelenburg secondary to hip weakness.  She required PT cuing to increase glute activation during single leg abduction but was able to complete without exacerbation of her pain. She has visible trunk shift Rt but this was not responsive to lateral shift in doorway. Pt had difficulty with properly engaging her deep abdominals and would continue to benefit from education regarding this moving forward.    Personal Factors and Comorbidities Time since onset of injury/illness/exacerbation;Age;Fitness    Examination-Activity Limitations Sit;Stand;Locomotion Level;Bend;Squat;Transfers;Stairs    Examination-Participation Restrictions Occupation    Stability/Clinical Decision Making Evolving/Moderate complexity    Rehab Potential Good    PT Frequency 2x / week    PT Duration 6 weeks    PT Treatment/Interventions ADLs/Self Care Home Management;Aquatic Therapy;Cryotherapy;Electrical Stimulation;Moist Heat;Stair training;Gait training;Functional mobility training;Therapeutic activities;Balance training;Therapeutic exercise;Neuromuscular re-education;Manual techniques;Patient/family education;Passive range of motion;Dry needling;Spinal Manipulations;Taping    PT Next Visit Plan glute strengthening in more neutral position progressing to deeper ranges as able; deep hip rotator stretches; engaging deep abdominals    PT Home Exercise Plan Access Code: [email or web address]    Consulted and Agree with Plan of Care Patient           Patient will benefit from skilled therapeutic intervention in order to improve the following deficits and impairments:  Abnormal gait, Decreased strength, Difficulty walking, Decreased activity tolerance, Hypomobility, Impaired flexibility, Increased muscle spasms, Postural dysfunction, Pain, Improper body mechanics  Visit Diagnosis: Chronic low back pain, unspecified back pain laterality, unspecified whether sciatica present  Other muscle spasm  Muscle weakness (generalized)     Problem List Patient Active Problem List    Diagnosis Date Noted  . 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 Coccydynia 05/07/2019    4:36 PM,11/27/19 Donita Brooks PT, DPT [facility]  [phone]  [facility] [address] [place], [place], [address] Phone: [phone]  Fax:  [phone]  Name: Teona Vargus MRN: [id number] Date of Birth: 1971-02-16

## 2019-12-27 ENCOUNTER — Ambulatory Visit: Payer: BC Managed Care – PPO | Attending: Family Medicine | Admitting: Physical Therapy

## 2019-12-27 ENCOUNTER — Other Ambulatory Visit: Payer: Self-pay

## 2019-12-27 ENCOUNTER — Encounter: Payer: Self-pay | Admitting: Physical Therapy

## 2019-12-27 DIAGNOSIS — M545 Low back pain, unspecified: Secondary | ICD-10-CM | POA: Insufficient documentation

## 2019-12-27 DIAGNOSIS — M6281 Muscle weakness (generalized): Secondary | ICD-10-CM | POA: Diagnosis not present

## 2019-12-27 DIAGNOSIS — M62838 Other muscle spasm: Secondary | ICD-10-CM | POA: Diagnosis not present

## 2019-12-27 DIAGNOSIS — G8929 Other chronic pain: Secondary | ICD-10-CM | POA: Diagnosis not present

## 2019-12-27 NOTE — Therapy (Signed)
[facility] [address] [place], [place], [address] Phone: [phone]   Fax:  [phone]  Physical Therapy Treatment  Patient Details  Name: Virginia Allen MRN: [id number] Date of Birth: [birth year] Referring Provider (PT): Clare Gandy, MD   Encounter Date: 12/27/2019   PT End of Session - 12/27/19 1709    Visit Number 6    Date for PT Re-Evaluation 02/07/20    Authorization Type BCBS PPO    Authorization Time Period 12/27/19 re-eval    PT Start Time 1617    PT Stop Time 1703    PT Time Calculation (min) 46 min    Activity Tolerance Patient tolerated treatment well;No increased pain    Behavior During Therapy [facility] for tasks assessed/performed           History reviewed. No pertinent past medical history.  Past Surgical History:  Procedure Laterality Date  . EYE SURGERY     For astigmatism     There were no vitals filed for this visit.   Subjective Assessment - 12/27/19 1620    Subjective Pt staters her movement is much better but when moving more the back locks up at night    Limitations Sitting;Standing;House hold activities;Walking    How long can you walk comfortably? avoids walking    Patient Stated Goals improve pain with walking    Currently in Pain? Yes    Pain Score 6     Pain Location Sacrum    Pain Orientation Posterior    Pain Descriptors / Indicators Aching    Pain Type Acute pain    Pain Onset More than a month ago              [facility] PT Assessment - 12/27/19 0001      Assessment   Medical Diagnosis Chronic Rt SI joint pain     Referring Provider (PT) Clare Gandy, MD      Strength   Overall Strength Comments Rt hip IR/ER 4+/5 MMT, Rt/Lt hip extension 3+/5 MMT, hip abduction 4/5 MMT                          [facility] Adult PT Treatment/Exercise - 12/27/19 0001      Lumbar Exercises: Aerobic   Nustep L2 x 6 min - PT present for status and goal review       Lumbar Exercises: Seated   Sit to Stand 5 reps    Sit to Stand Limitations 26 sec      Lumbar Exercises: Sidelying   Clam Right;Left;10 reps      Manual Therapy   Manual therapy comments kinesiotape: Star taping over sacrum - 3 strips; addaday to bilat gluteals                    PT Short Term Goals - 12/27/19 1624      PT SHORT TERM GOAL #1   Title Pt will be independent with her initial HEP to improve pain and LE strength.    Status Achieved             PT Long Term Goals - 12/27/19 1624      PT LONG TERM GOAL #1   Title Pt will have improved hip strength to atleast 4+/5 MMT which will improve her efficiency with daily activity.    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    Baseline 15 minutes maximum    Status Achieved      PT LONG TERM GOAL #3   Title Pt will be able to complete 5x sit to stand in less than 20 sec without the need for UE support to reflect improvements in pain and strength with transitions.    Baseline 20 sec    Status On-going    Target Date 02/07/20      PT LONG TERM GOAL #4   Title Pt wil be able to ascend and descend clinic steps with reciprocal pattern x3 trials, with no more than 3/10 discomfort.    Baseline I am going one by one and to the side    Status On-going    Target Date 02/07/20      PT LONG TERM GOAL #5   Title Pt will report atleast 60% improvement in her pain from the start of PT which will improve her quality of life.    Baseline 60% better    Status Achieved                 Plan - 12/27/19 1705    Clinical Impression Statement Pt was seen about one month ago and needing re-eval today.  Pt continues to demosntrate progress with goals.  She is still going up steps sideways with Rt LE leading and has  Trendelenburg on Lt when walking . Pt has Lt hip abduction weakness.  She has greatly improved hip strength with ER/IR.  Pt responded well to star taping of sacrum.  Will continue to benefit from skilled PT to strength hip and core in order to improve functional movements, transitions, and stairs.    Personal Factors and Comorbidities Time since onset of injury/illness/exacerbation;Age;Fitness    Examination-Activity Limitations Sit;Stand;Locomotion Level;Bend;Squat;Transfers;Stairs    Examination-Participation Restrictions Occupation    Rehab Potential Good    PT Frequency 1x / week   pt states can only come 1x/week   PT Duration 6 weeks    PT Treatment/Interventions ADLs/Self Care Home Management;Aquatic Therapy;Cryotherapy;Electrical Stimulation;Moist Heat;Stair training;Gait training;Functional mobility training;Therapeutic activities;Balance training;Therapeutic exercise;Neuromuscular re-education;Manual techniques;Patient/family education;Passive range of motion;Dry needling;Spinal Manipulations;Taping    PT Next Visit Plan glute strengthening in more neutral position progressing to deeper ranges as able; deep hip rotator stretches; engaging deep abdominals    PT Home Exercise Plan Access Code: [email or web address]    Consulted and Agree with Plan of Care Patient           Patient will benefit from skilled therapeutic intervention in order to improve the following deficits and impairments:  Abnormal gait, Decreased strength, Difficulty walking, Decreased activity tolerance, Hypomobility, Impaired flexibility, Increased muscle spasms, Postural dysfunction, Pain, Improper body mechanics  Visit Diagnosis: Chronic low back pain, unspecified back pain laterality, unspecified whether sciatica present  Other muscle spasm  Muscle weakness (generalized)     Problem List Patient Active Problem List   Diagnosis Date Noted  . 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 Coccydynia 05/07/2019    Junious Silk, PT 12/27/2019, 5:11 PM   [facility] [address] [place], [place], [address] Phone: [phone]   Fax:  [phone]  Name: Jevon Littlepage MRN: [id number] Date of Birth: 02/26/1971

## 2020-01-01 ENCOUNTER — Other Ambulatory Visit: Payer: Self-pay

## 2020-01-01 ENCOUNTER — Encounter: Payer: Self-pay | Admitting: Physical Therapy

## 2020-01-01 ENCOUNTER — Ambulatory Visit: Payer: BC Managed Care – PPO | Admitting: Physical Therapy

## 2020-01-01 DIAGNOSIS — M62838 Other muscle spasm: Secondary | ICD-10-CM | POA: Diagnosis not present

## 2020-01-01 DIAGNOSIS — M6281 Muscle weakness (generalized): Secondary | ICD-10-CM

## 2020-01-01 DIAGNOSIS — M545 Low back pain, unspecified: Secondary | ICD-10-CM | POA: Diagnosis not present

## 2020-01-01 DIAGNOSIS — G8929 Other chronic pain: Secondary | ICD-10-CM | POA: Diagnosis not present

## 2020-01-01 NOTE — Therapy (Signed)
[facility] [address] [place], [place], [address] Phone: [phone]   Fax:  [phone]  Physical Therapy Treatment  Patient Details  Name: Narya Beavin MRN: [id number] Date of Birth: May 20, 1970 Referring Provider (PT): Clare Gandy, MD   Encounter Date: 01/01/2020   PT End of Session - 01/01/20 1710    Visit Number 7    Date for PT Re-Evaluation 02/07/20    Authorization Type BCBS PPO    Authorization Time Period 12/27/19 - 02/07/20    Authorization - Visit Number 7    Authorization - Number of Visits 30    PT Start Time 1640   Pt 25 min late, was ok with a 20 min visit   PT Stop Time 1703    PT Time Calculation (min) 23 min    Activity Tolerance Patient tolerated treatment well;No increased pain    Behavior During Therapy [facility] for tasks assessed/performed           History reviewed. No pertinent past medical history.  Past Surgical History:  Procedure Laterality Date   EYE SURGERY     For astigmatism     There were no vitals filed for this visit.   Subjective Assessment - 01/01/20 1642    Subjective Pt 25 min late, ok with 20 min PT appointment.  "I had a flare up of pain over the weekend and now feel more pain.  Pain when I walk, bend, and can't stand up straight."    How long can you walk comfortably? avoids walking    Patient Stated Goals improve pain with walking    Currently in Pain? Yes    Pain Score 8     Pain Location Back    Pain Orientation Left;Mid;Lower    Pain Descriptors / Indicators Spasm;Tightness    Pain Type Acute pain    Pain Onset More than a month ago    Pain Frequency Intermittent    Aggravating Factors  bend, walk                             [facility] Adult PT Treatment/Exercise - 01/01/20 0001      Neuro Re-ed    Neuro Re-ed Details  TCs with gait for transverse pelvic motion and VCs for glut med activation in stance phase      Lumbar  Exercises: Seated   Other Seated Lumbar Exercises lumbar extension 3x5 sec holds, Pt reports relief with this      Lumbar Exercises: Prone   Other Prone Lumbar Exercises press up on elbows 2x10 sec      Manual Therapy   Manual Therapy Soft tissue mobilization;Myofascial release    Manual therapy comments removed KT tape from last visit - Pt had not removed    Soft tissue mobilization Lt QL and Lt lumbar and lower thoracic paraspinals    Myofascial Release lumbar skin rolling midline L5-T12, thoracodorsal fascia bil inferior to superior, back to front at diagonals from midline bil                    PT Short Term Goals - 12/27/19 1624      PT SHORT TERM GOAL #1   Title Pt will be independent with her initial HEP to improve pain and LE strength.    Status Achieved             PT Long Term Goals -  12/27/19 1624      PT LONG TERM GOAL #1   Title Pt will have improved hip strength to atleast 4+/5 MMT which will improve her efficiency with daily activity.    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    Baseline 15 minutes maximum    Status Achieved      PT LONG TERM GOAL #3   Title Pt will be able to complete 5x sit to stand in less than 20 sec without the need for UE support to reflect improvements in pain and strength with transitions.    Baseline 20 sec    Status On-going    Target Date 02/07/20      PT LONG TERM GOAL #4   Title Pt wil be able to ascend and descend clinic steps with reciprocal pattern x3 trials, with no more than 3/10 discomfort.    Baseline I am going one by one and to the side    Status On-going    Target Date 02/07/20      PT LONG TERM GOAL #5   Title Pt will report atleast 60% improvement in her pain from the start of PT which will improve  her quality of life.    Baseline 60% better    Status Achieved                 Plan - 01/01/20 1712    Clinical Impression Statement Pt arrived 25 min late.  She had a flare up over the weekend and presented with flexed trunk posture and antalgic gait.  She had a Lt lumbar spasm and pain with trunk flexion and bil SB.  Relief with extension.  PT removed KT tape from last visit, performed STM and myofascial release to lumbar in prone and had Pt trial prone press ups and standing trunk ext.  Pt reported pain reduction end of session from 8/10 to 5/10 and had signif improvement in upright posture and gait pattern end of session.  PT cued with VCs and TCs improved transverse pelvic motion and glut med in stance phase.  PT encouraged limit sitting and bending and for Pt to continue extension based ther ex if it continues to be helpful for pain levels.    PT Frequency 1x / week    PT Duration 6 weeks    PT Treatment/Interventions ADLs/Self Care Home Management;Aquatic Therapy;Cryotherapy;Electrical Stimulation;Moist Heat;Stair training;Gait training;Functional mobility training;Therapeutic activities;Balance training;Therapeutic exercise;Neuromuscular re-education;Manual techniques;Patient/family education;Passive range of motion;Dry needling;Spinal Manipulations;Taping    PT Next Visit Plan f/u on flare up, lumbar myofascial release/skin rolling, hip strength in neutral, deep hip rotator stretch, engage abdominals, gait training    PT Home Exercise Plan Access Code: [email or web address]    Consulted and Agree with Plan of Care Patient           Patient will benefit from skilled therapeutic intervention in order to improve the following deficits and impairments:     Visit Diagnosis: Chronic low back pain, unspecified back pain laterality, unspecified whether sciatica present  Other muscle spasm  Muscle weakness (generalized)     Problem List Patient Active Problem List   Diagnosis Date Noted     Chronic right SI joint pain  08/16/2019   Coccydynia 05/07/2019   Davonn Flanery, PT 01/01/20 5:16 PM   [facility] [address] [place], [place], [address] Phone: [phone]   Fax:  [phone]  Name: Camrynn Mcclintic MRN: [id number] Date of Birth: [birth year]

## 2020-01-08 ENCOUNTER — Ambulatory Visit: Payer: BC Managed Care – PPO | Admitting: Physical Therapy

## 2020-01-08 ENCOUNTER — Encounter: Payer: Self-pay | Admitting: Physical Therapy

## 2020-01-08 ENCOUNTER — Other Ambulatory Visit: Payer: Self-pay

## 2020-01-08 DIAGNOSIS — M6281 Muscle weakness (generalized): Secondary | ICD-10-CM | POA: Diagnosis not present

## 2020-01-08 DIAGNOSIS — M62838 Other muscle spasm: Secondary | ICD-10-CM

## 2020-01-08 DIAGNOSIS — G8929 Other chronic pain: Secondary | ICD-10-CM

## 2020-01-08 DIAGNOSIS — M545 Low back pain, unspecified: Secondary | ICD-10-CM | POA: Diagnosis not present

## 2020-01-08 NOTE — Therapy (Signed)
[facility] [address] [place], [place], [address] Phone: [phone]   Fax:  [phone]  Physical Therapy Treatment  Patient Details  Name: Virginia Allen MRN: [id number] Date of Birth: 05-24-1970 Referring Provider (PT): Clare Gandy, MD   Encounter Date: 01/08/2020   PT End of Session - 01/08/20 1625    Visit Number 8    Date for PT Re-Evaluation 02/07/20    Authorization Type BCBS PPO    Authorization Time Period 12/27/19 - 02/07/20    Authorization - Visit Number 8    Authorization - Number of Visits 30    PT Start Time 1620    PT Stop Time 1658    PT Time Calculation (min) 38 min    Activity Tolerance Patient tolerated treatment well;No increased pain    Behavior During Therapy [facility] for tasks assessed/performed           History reviewed. No pertinent past medical history.  Past Surgical History:  Procedure Laterality Date  . EYE SURGERY     For astigmatism     There were no vitals filed for this visit.   Subjective Assessment - 01/08/20 1622    Subjective I feel better than last time.  Laying on my stomach and doing press ups helped.  5/10 pain.  I had a lot of pain standing and cooking breakfast on Sunday.  Had to go lay down.    Limitations Sitting;Standing;House hold activities;Walking    How long can you walk comfortably? avoids walking    Patient Stated Goals improve pain with walking    Currently in Pain? Yes    Pain Score 5     Pain Location Back    Pain Orientation Right;Left;Mid;Lower    Pain Descriptors / Indicators Spasm;Tightness;Heaviness    Pain Type Acute pain    Pain Onset More than a month ago    Pain Frequency Intermittent    Aggravating Factors  walk, stand, prolonged sitting                             [facility] Adult PT Treatment/Exercise - 01/08/20 0001      Exercises   Exercises Knee/Hip      Lumbar Exercises: Aerobic   Nustep L2 x 5'  PT present to discuss status      Lumbar Exercises: Standing   Other Standing Lumbar Exercises rebounder weight shifts Lt/Rt and stagger stance x 30 sec each      Lumbar Exercises: Supine   Dead Bug Limitations arms only 2lb, LEs in hooklying feet on mat table, PT TCs for knitting ribcage and TrA      Knee/Hip Exercises: Seated   Sit to Sand 5 reps   from high table, TrA VCs and PT demo hip hinge     Knee/Hip Exercises: Supine   Other Supine Knee/Hip Exercises whole leg press into mat table 5x5 sec holds Rt/Lt, pillow under pelvis, contralateral knee bent, VCs for TrA during hold    Other Supine Knee/Hip Exercises yellow loop band hooklying hip abd 5x5 sec holds isometric      Manual Therapy   Manual Therapy Taping    Kinesiotex Facilitate Muscle      Kinesiotix   Facilitate Muscle  lumbar star pattern 3 I strips                    PT Short Term Goals - 12/27/19  1624      PT SHORT TERM GOAL #1   Title Pt will be independent with her initial HEP to improve pain and LE strength.    Status Achieved             PT Long Term Goals - 12/27/19 1624      PT LONG TERM GOAL #1   Title Pt will have improved hip strength to atleast 4+/5 MMT which will improve her efficiency with daily activity.    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    Baseline 15 minutes maximum    Status Achieved      PT LONG TERM GOAL #3   Title Pt will be able to complete 5x sit to stand in less than 20 sec without the need for UE support to reflect improvements in pain and strength with transitions.    Baseline 20 sec    Status On-going    Target Date 02/07/20      PT LONG TERM GOAL #4   Title Pt wil be able to ascend and descend clinic steps with reciprocal pattern x3 trials, with no  more than 3/10 discomfort.    Baseline I am going one by one and to the side    Status On-going    Target Date 02/07/20      PT LONG TERM GOAL #5   Title Pt will report atleast 60% improvement in her pain from the start of PT which will improve her quality of life.    Baseline 60% better    Status Achieved                 Plan - 01/08/20 1658    Clinical Impression Statement Pt arrived with 5/10 pain reporting slight improvement in pain since last visit.  She was able to tolerate hip isometrics and small range sit to stands from elevated mat table today.  PT trialed rebounder weight shifts which challenged her closed chain hip stability and endurance (fatigue with this).  She is learning improved abdominal contractions with need for TC for ribcage knitting and in-drawing.  PT performed star pattern KT tape for lumbar supoort today given this helped Pt in past.  She reported reduced pain end of session with a "different kind of pain as though she had exercised and was tired."  She will continue to benefit from skilled progression with ongoing assessment of response to interventions.    PT Next Visit Plan continue hip strength/stabilization as tol, deep hip rotator stretches, lumbar myofascial release/skin rolling, closed chain hip stability, core neuro re-ed    PT Home Exercise Plan Access Code: [email or web address]    Consulted and Agree with Plan of Care Patient           Patient will benefit from skilled therapeutic intervention in order to improve the following deficits and impairments:     Visit Diagnosis: Chronic low back pain, unspecified back pain laterality, unspecified whether sciatica present  Other muscle spasm  Muscle weakness (generalized)     Problem List Patient Active Problem List   Diagnosis Date Noted  . 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 Coccydynia 05/07/2019    Hadja Harral,  PT 01/08/20 5:03 PM   [facility] [address], [address] [place], [place], [address] Phone: [phone]   Fax:  [phone]  Name: Shenicka Sunderlin MRN: [id number] Date of Birth: 12-Feb-1971

## 2020-01-10 ENCOUNTER — Other Ambulatory Visit: Payer: Self-pay

## 2020-01-10 ENCOUNTER — Encounter: Payer: Self-pay | Admitting: Physical Therapy

## 2020-01-10 ENCOUNTER — Ambulatory Visit: Payer: BC Managed Care – PPO | Admitting: Physical Therapy

## 2020-01-10 DIAGNOSIS — M62838 Other muscle spasm: Secondary | ICD-10-CM | POA: Diagnosis not present

## 2020-01-10 DIAGNOSIS — M6281 Muscle weakness (generalized): Secondary | ICD-10-CM

## 2020-01-10 DIAGNOSIS — G8929 Other chronic pain: Secondary | ICD-10-CM | POA: Diagnosis not present

## 2020-01-10 DIAGNOSIS — M545 Low back pain, unspecified: Secondary | ICD-10-CM

## 2020-01-10 NOTE — Therapy (Signed)
[facility] [address], [address] [place], [place], [address] Phone: [phone]   Fax:  [phone]  Physical Therapy Treatment  Patient Details  Name: Virginia Allen MRN: [id number] Date of Birth: 09-18-1970 Referring Provider (PT): Clare Gandy, MD   Encounter Date: 01/10/2020   PT End of Session - 01/10/20 1620    Visit Number 9    Date for PT Re-Evaluation 02/07/20    Authorization Type BCBS PPO    Authorization Time Period 12/27/19 - 02/07/20    Authorization - Visit Number 9    Authorization - Number of Visits 30    PT Start Time 1615    PT Stop Time 1653    PT Time Calculation (min) 38 min    Activity Tolerance Patient tolerated treatment well;No increased pain    Behavior During Therapy [facility] for tasks assessed/performed           History reviewed. No pertinent past medical history.  Past Surgical History:  Procedure Laterality Date  . EYE SURGERY     For astigmatism     There were no vitals filed for this visit.   Subjective Assessment - 01/10/20 1617    Subjective Pt states the pain is low today. It is up and down but cannot stand for 5 minutes    Limitations Sitting;Standing;House hold activities;Walking    Patient Stated Goals improve pain with walking    Currently in Pain? Yes    Pain Score 5     Pain Location Back    Pain Orientation Right;Left;Mid;Lower    Pain Descriptors / Indicators Spasm    Pain Type Acute pain    Pain Onset More than a month ago    Pain Frequency Intermittent    Multiple Pain Sites No                             [facility] Adult PT Treatment/Exercise - 01/10/20 0001      Lumbar Exercises: Aerobic   Nustep L2 x 5' PT present to discuss status      Lumbar Exercises: Supine   Bent Knee Raise 15 reps    Large Ball Abdominal Isometric Limitations ball roll outs and overhead - 20x ; roll outs with figure 4 10x each side      Manual Therapy    Soft tissue mobilization lumbar and gluteal attachments bilat    Myofascial Release thoracolumbar fascial release with pin and stretch fascial technique    Kinesiotex Facilitate Muscle      Kinesiotix   Facilitate Muscle  lumbar star pattern 3 I strips                    PT Short Term Goals - 12/27/19 1624      PT SHORT TERM GOAL #1   Title Pt will be independent with her initial HEP to improve pain and LE strength.    Status Achieved             PT Long Term Goals - 12/27/19 1624      PT LONG TERM GOAL #1   Title Pt will have improved hip strength to atleast 4+/5 MMT which will improve her efficiency with daily activity.    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    Baseline 15 minutes maximum    Status Achieved      PT LONG TERM GOAL #3   Title Pt will be able to complete 5x sit to stand in less than 20 sec without the need for UE support to reflect improvements in pain and strength with transitions.    Baseline 20 sec    Status On-going    Target Date 02/07/20      PT LONG TERM GOAL #4   Title Pt wil be able to ascend and descend clinic steps with reciprocal pattern x3 trials, with no more than 3/10 discomfort.    Baseline I am going one by one and to the side    Status On-going    Target Date 02/07/20      PT LONG TERM GOAL #5   Title Pt will report atleast 60% improvement in her pain from the start of PT which will improve her quality of life.    Baseline 60% better    Status Achieved                 Plan - 01/10/20 1702    Clinical Impression Statement Today's session focused on myofascial and STM to release tension posteriorly prior to abdominal strengthening.  Pt reports feeling the exercises and feels better after today's treatment.  Pt  still reporting only standing for about 5 minutes before she needs to sit down.  Continue with core strength and facilitating reduced muscle spasms in low back and gluteals    PT Treatment/Interventions ADLs/Self Care Home Management;Aquatic Therapy;Cryotherapy;Electrical Stimulation;Moist Heat;Stair training;Gait training;Functional mobility training;Therapeutic activities;Balance training;Therapeutic exercise;Neuromuscular re-education;Manual techniques;Patient/family education;Passive range of motion;Dry needling;Spinal Manipulations;Taping    PT Next Visit Plan continue hip strength/stabilization as tol, deep hip rotator stretches, lumbar myofascial release/skin rolling, closed chain hip stability, core neuro re-ed    PT Home Exercise Plan Access Code: [email or web address]    Consulted and Agree with Plan of Care Patient           Patient will benefit from skilled therapeutic intervention in order to improve the following deficits and impairments:  Abnormal gait, Decreased strength, Difficulty walking, Decreased activity tolerance, Hypomobility, Impaired flexibility, Increased muscle spasms, Postural dysfunction, Pain, Improper body mechanics  Visit Diagnosis: Chronic low back pain, unspecified back pain laterality, unspecified whether sciatica present  Other muscle spasm  Muscle weakness (generalized)     Problem List Patient Active Problem List   Diagnosis Date Noted  . 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 Coccydynia 05/07/2019    Junious Silk, PT 01/10/2020, 5:14 PM  [facility] [address] [place], [place], [address] Phone: [phone]   Fax:  [phone]  Name: Dhalia Zingaro MRN: [id number] Date of Birth: Aug 25, 1970

## 2020-01-15 ENCOUNTER — Encounter: Payer: BC Managed Care – PPO | Admitting: Physical Therapy

## 2020-01-17 ENCOUNTER — Other Ambulatory Visit: Payer: Self-pay

## 2020-01-17 ENCOUNTER — Encounter: Payer: Self-pay | Admitting: Physical Therapy

## 2020-01-17 ENCOUNTER — Ambulatory Visit: Payer: BC Managed Care – PPO | Admitting: Physical Therapy

## 2020-01-17 DIAGNOSIS — M6281 Muscle weakness (generalized): Secondary | ICD-10-CM

## 2020-01-17 DIAGNOSIS — G8929 Other chronic pain: Secondary | ICD-10-CM

## 2020-01-17 DIAGNOSIS — M62838 Other muscle spasm: Secondary | ICD-10-CM | POA: Diagnosis not present

## 2020-01-17 DIAGNOSIS — M545 Low back pain, unspecified: Secondary | ICD-10-CM | POA: Diagnosis not present

## 2020-01-17 NOTE — Therapy (Signed)
[facility] [address] [place], [place], [address] Phone: [phone]   Fax:  [phone]  Physical Therapy Treatment  Patient Details  Name: Virginia Allen MRN: [id number] Date of Birth: 04/11/1970 Referring Provider (PT): Clare Gandy, MD   Encounter Date: 01/17/2020   PT End of Session - 01/17/20 1545    Visit Number 10    Date for PT Re-Evaluation 02/07/20    Authorization Type BCBS PPO    Authorization Time Period 12/27/19 - 02/07/20    Authorization - Visit Number 10    Authorization - Number of Visits 30    PT Start Time 1533    PT Stop Time 1611    PT Time Calculation (min) 38 min    Activity Tolerance Patient tolerated treatment well;No increased pain    Behavior During Therapy [facility] for tasks assessed/performed           History reviewed. No pertinent past medical history.  Past Surgical History:  Procedure Laterality Date  . EYE SURGERY     For astigmatism     There were no vitals filed for this visit.   Subjective Assessment - 01/17/20 1536    Subjective Yesterday I had difficulty when I was standing for 5 minutes and all of a sudden I couldn't lift my legs.  This is what has always happened.  I have the stabbing pain much less and is now only 0-1/month down from several times per week.  I had the stabbing pain today.    How long can you walk comfortably? avoids walking    Patient Stated Goals improve pain with walking    Currently in Pain? Yes    Pain Score 6     Pain Location Back    Pain Orientation Right;Left;Mid;Lower    Pain Descriptors / Indicators Discomfort;Burning   deep in the center   Pain Type Chronic pain    Pain Onset More than a month ago    Pain Frequency Intermittent    Multiple Pain Sites No                                       PT Short Term Goals - 12/27/19 1624      PT SHORT TERM GOAL #1   Title Pt will be independent with  her initial HEP to improve pain and LE strength.    Status Achieved             PT Long Term Goals - 12/27/19 1624      PT LONG TERM GOAL #1   Title Pt will have improved hip strength to atleast 4+/5 MMT which will improve her efficiency with daily activity.    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    Baseline 15 minutes maximum    Status Achieved      PT LONG TERM GOAL #3   Title Pt will be able to complete 5x sit to stand in less than 20 sec without the need for UE support to reflect improvements in pain and strength with transitions.  Baseline 20 sec    Status On-going    Target Date 02/07/20      PT LONG TERM GOAL #4   Title Pt wil be able to ascend and descend clinic steps with reciprocal pattern x3 trials, with no more than 3/10 discomfort.    Baseline I am going one by one and to the side    Status On-going    Target Date 02/07/20      PT LONG TERM GOAL #5   Title Pt will report atleast 60% improvement in her pain from the start of PT which will improve her quality of life.    Baseline 60% better    Status Achieved                  Patient will benefit from skilled therapeutic intervention in order to improve the following deficits and impairments:     Visit Diagnosis: Chronic low back pain, unspecified back pain laterality, unspecified whether sciatica present  Other muscle spasm  Muscle weakness (generalized)     Problem List Patient Active Problem List   Diagnosis Date Noted  . 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 Coccydynia 05/07/2019    Junious Silk, PT 01/17/2020, 5:16 PM  [facility] [address], [address] [place], [place], [address] Phone: [phone]   Fax:   [phone]  Name: Virginia Allen MRN: [id number] Date of Birth: 06-04-1970

## 2020-01-18 NOTE — Therapy (Signed)
[facility] [address] [place], [place], [address] Phone: [phone]   Fax:  [phone]  Physical Therapy Treatment  Patient Details  Name: Virginia Allen MRN: [id number] Date of Birth: [birth year] Referring Provider (PT): Clare Gandy, MD   Encounter Date: 01/17/2020   PT End of Session - 01/17/20 1545    Visit Number 10    Date for PT Re-Evaluation 02/07/20    Authorization Type BCBS PPO    Authorization Time Period 12/27/19 - 02/07/20    Authorization - Visit Number 10    Authorization - Number of Visits 30    PT Start Time 1533    PT Stop Time 1611    PT Time Calculation (min) 38 min    Activity Tolerance Patient tolerated treatment well;No increased pain    Behavior During Therapy [facility] for tasks assessed/performed           History reviewed. No pertinent past medical history.  Past Surgical History:  Procedure Laterality Date  . EYE SURGERY     For astigmatism     There were no vitals filed for this visit.   Subjective Assessment - 01/17/20 1536    Subjective Yesterday I had difficulty when I was standing for 5 minutes and all of a sudden I couldn't lift my legs.  This is what has always happened.  I have the stabbing pain much less and is now only 0-1/month down from several times per week.  I had the stabbing pain today.    How long can you walk comfortably? avoids walking    Patient Stated Goals improve pain with walking    Currently in Pain? Yes    Pain Score 6     Pain Location Back    Pain Orientation Right;Left;Mid;Lower    Pain Descriptors / Indicators Discomfort;Burning   deep in the center   Pain Type Chronic pain    Pain Onset More than a month ago    Pain Frequency Intermittent    Multiple Pain Sites No                             [facility] Adult PT Treatment/Exercise - 01/18/20 0001      Lumbar Exercises: Aerobic   Nustep L2 x 7' PT present to discuss  status      Lumbar Exercises: Supine   AB Set Limitations breathing with cues to engage core on exhale; TC for ribcage lowered    Other Supine Lumbar Exercises UE ext i nsupine red band - 20x    Other Supine Lumbar Exercises foam roller under sacrum KTC single and double and rocking      Lumbar Exercises: Quadruped   Other Quadruped Lumbar Exercises supported child pose with breathing into lumbar      Manual Therapy   Myofascial Release thoracolumbar fascial release with pin and stretch fascial technique    Kinesiotex Inhibit Muscle      Kinesiotix   Inhibit Muscle  lumbar star pattern 3 I strips    Facilitate Muscle  --                    PT Short Term Goals - 12/27/19 1624      PT SHORT TERM GOAL #1   Title Pt will be independent with her initial HEP to improve pain and LE strength.    Status Achieved  PT Long Term Goals - 12/27/19 1624      PT LONG TERM GOAL #1   Title Pt will have improved hip strength to atleast 4+/5 MMT which will improve her efficiency with daily activity.    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    Baseline 15 minutes maximum    Status Achieved      PT LONG TERM GOAL #3   Title Pt will be able to complete 5x sit to stand in less than 20 sec without the need for UE support to reflect improvements in pain and strength with transitions.    Baseline 20 sec    Status On-going    Target Date 02/07/20      PT LONG TERM GOAL #4   Title Pt wil be able to ascend and descend clinic steps with reciprocal pattern x3 trials, with no more than 3/10 discomfort.    Baseline I am going one by one and to the side    Status On-going    Target Date 02/07/20      PT LONG TERM GOAL #5   Title Pt will report atleast 60% improvement in  her pain from the start of PT which will improve her quality of life.    Baseline 60% better    Status Achieved                 Plan - 01/18/20 1003    Clinical Impression Statement Today's session focused on developing core with TrA and oblique activation.  Pt needed cues to prevent holding her breath.  Pt responded well to fascial release in supported child pose.  pt reports feeling better after treatment today.    PT Treatment/Interventions ADLs/Self Care Home Management;Aquatic Therapy;Cryotherapy;Electrical Stimulation;Moist Heat;Stair training;Gait training;Functional mobility training;Therapeutic activities;Balance training;Therapeutic exercise;Neuromuscular re-education;Manual techniques;Patient/family education;Passive range of motion;Dry needling;Spinal Manipulations;Taping    PT Next Visit Plan core strength and diaphragmatic breathing; lumbar and gluteal fascial and trigger point release    PT Home Exercise Plan Access Code: [email or web address]    Consulted and Agree with Plan of Care Patient           Patient will benefit from skilled therapeutic intervention in order to improve the following deficits and impairments:  Abnormal gait, Decreased strength, Difficulty walking, Decreased activity tolerance, Hypomobility, Impaired flexibility, Increased muscle spasms, Postural dysfunction, Pain, Improper body mechanics  Visit Diagnosis: Chronic low back pain, unspecified back pain laterality, unspecified whether sciatica present  Other muscle spasm  Muscle weakness (generalized)     Problem List Patient Active Problem List   Diagnosis Date Noted  . 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 Coccydynia 05/07/2019    Junious Silk, PT 01/18/2020, 10:13 AM  [facility] [address], [address] [place], [place], [address] Phone: [phone]   Fax:  [phone]  Name: Exilda Wilhite MRN: [id number] Date of  Birth: June 04, 1970

## 2020-01-23 ENCOUNTER — Ambulatory Visit: Payer: BC Managed Care – PPO | Attending: Family Medicine | Admitting: Physical Therapy

## 2020-01-23 ENCOUNTER — Encounter: Payer: Self-pay | Admitting: Physical Therapy

## 2020-01-23 ENCOUNTER — Other Ambulatory Visit: Payer: Self-pay

## 2020-01-23 DIAGNOSIS — G8929 Other chronic pain: Secondary | ICD-10-CM | POA: Insufficient documentation

## 2020-01-23 DIAGNOSIS — M6281 Muscle weakness (generalized): Secondary | ICD-10-CM

## 2020-01-23 DIAGNOSIS — M545 Low back pain, unspecified: Secondary | ICD-10-CM

## 2020-01-23 DIAGNOSIS — M62838 Other muscle spasm: Secondary | ICD-10-CM | POA: Diagnosis not present

## 2020-01-23 NOTE — Therapy (Addendum)
[facility] [address], [address] [place], [place], [address] Phone: [phone]   Fax:  [phone]  Physical Therapy Treatment  Patient Details  Name: Virginia Allen MRN: [id number] Date of Birth: 1970/05/29 Referring Provider (PT): Clare Gandy, MD   Encounter Date: 01/23/2020   PT End of Session - 01/23/20 1614    Visit Number 11    Date for PT Re-Evaluation 02/07/20    Authorization Type BCBS PPO    Authorization Time Period 12/27/19 - 02/07/20    Authorization - Visit Number 11    Authorization - Number of Visits 30    PT Start Time 1531    PT Stop Time 1613    PT Time Calculation (min) 42 min    Activity Tolerance Patient tolerated treatment well;No increased pain    Behavior During Therapy [facility] for tasks assessed/performed           History reviewed. No pertinent past medical history.  Past Surgical History:  Procedure Laterality Date  . EYE SURGERY     For astigmatism     There were no vitals filed for this visit.   Subjective Assessment - 01/23/20 1537    Subjective Pt states she is feeling a little better this week.  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    Currently in Pain? Yes    Pain Score 4     Pain Location Back    Pain Orientation Medial;Lower    Pain Descriptors / Indicators Discomfort    Pain Type Chronic pain    Pain Onset More than a month ago    Pain Frequency Intermittent    Multiple Pain Sites No                             [facility] Adult PT Treatment/Exercise - 01/28/20 0001      Neuro Re-ed    Neuro Re-ed Details  TC for TrA activation      Lumbar Exercises: Aerobic   Nustep L2 x 7' PT present to discuss status      Lumbar Exercises: Seated   Sit to Stand 5 reps   2 sets     Lumbar Exercises: Supine   Dead Bug 10 reps    Bridge 10 reps    Bridge with [employer] 10 reps    Other Supine Lumbar Exercises UE ext in  supine red band - 20x      Manual Therapy   Kinesiotex Inhibit Muscle      Kinesiotix   Inhibit Muscle  lumbar star pattern 3 I strips                    PT Short Term Goals - 12/27/19 1624      PT SHORT TERM GOAL #1   Title Pt will be independent with her initial HEP to improve pain and LE strength.    Status Achieved             PT Long Term Goals - 12/27/19 1624      PT LONG TERM GOAL #1   Title Pt will have improved hip strength to atleast 4+/5 MMT which will improve her efficiency with daily activity.    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    Baseline 15 minutes maximum    Status Achieved      PT LONG TERM GOAL #3   Title Pt will be able to complete 5x sit to stand in less than 20 sec without the need for UE support to reflect improvements in pain and strength with transitions.    Baseline 20 sec    Status On-going    Target Date 02/07/20      PT LONG TERM GOAL #4   Title Pt wil be able to ascend and descend clinic steps with reciprocal pattern x3 trials, with no more than 3/10 discomfort.    Baseline I am going one by one and to the side    Status On-going    Target Date 02/07/20      PT LONG TERM GOAL #5   Title Pt will report atleast 60% improvement in her pain from the start of PT which will improve her quality of life.    Baseline 60% better    Status Achieved                 Plan - 01/28/20 1908    Clinical Impression Statement Today's session focused more on exercises progression as pt was feeling better today.  Pt continues to report she is standing very little due to pain.  Pt felt that tape is helping her stand upright more easily so taping still added to treatment today.  Pt will continue to benefit from skilled PT to work on  core strength and pain management for reduced muscle spasms in the back.    PT Treatment/Interventions ADLs/Self Care Home Management;Aquatic Therapy;Cryotherapy;Electrical Stimulation;Moist Heat;Stair training;Gait training;Functional mobility training;Therapeutic activities;Balance training;Therapeutic exercise;Neuromuscular re-education;Manual techniques;Patient/family education;Passive range of motion;Dry needling;Spinal Manipulations;Taping    PT Next Visit Plan core strength and diaphragmatic breathing; lumbar and gluteal fascial and trigger point release; start taping to lumbar as needed    PT Home Exercise Plan Access Code: [email or web address]    Consulted and Agree with Plan of Care Patient           Patient will benefit from skilled therapeutic intervention in order to improve the following deficits and impairments:  Abnormal gait, Decreased strength, Difficulty walking, Decreased activity tolerance, Hypomobility, Impaired flexibility, Increased muscle spasms, Postural dysfunction, Pain, Improper body mechanics  Visit Diagnosis: Chronic low back pain, unspecified back pain laterality, unspecified whether sciatica present  Other muscle spasm  Muscle weakness (generalized)     Problem List Patient Active Problem List   Diagnosis Date Noted  . 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 Coccydynia 05/07/2019    Junious Silk, PT 01/28/2020, 7:15 PM  [facility] [address], [address] [place], [place], [address] Phone: [phone]   Fax:  [phone]  Name: Virginia Allen MRN: [id number] Date of Birth: Nov 04, 1970

## 2020-01-30 ENCOUNTER — Encounter: Payer: Self-pay | Admitting: Physical Therapy

## 2020-01-30 ENCOUNTER — Other Ambulatory Visit: Payer: Self-pay

## 2020-01-30 ENCOUNTER — Ambulatory Visit: Payer: BC Managed Care – PPO | Admitting: Physical Therapy

## 2020-01-30 DIAGNOSIS — M545 Low back pain, unspecified: Secondary | ICD-10-CM

## 2020-01-30 DIAGNOSIS — M62838 Other muscle spasm: Secondary | ICD-10-CM

## 2020-01-30 DIAGNOSIS — G8929 Other chronic pain: Secondary | ICD-10-CM

## 2020-01-30 DIAGNOSIS — M6281 Muscle weakness (generalized): Secondary | ICD-10-CM | POA: Diagnosis not present

## 2020-01-30 NOTE — Therapy (Signed)
[facility] [address] [place], [place], [address] Phone: [phone]   Fax:  [phone]  Physical Therapy Treatment  Patient Details  Name: Virginia Allen MRN: [id number] Date of Birth: December 03, 1970 Referring Provider (PT): Clare Gandy, MD   Encounter Date: 01/30/2020   PT End of Session - 01/30/20 1731    Visit Number 12    Date for PT Re-Evaluation 03/12/20    Authorization Type BCBS PPO    Authorization - Visit Number 12    Authorization - Number of Visits 30    PT Start Time 1533    PT Stop Time 1620    PT Time Calculation (min) 47 min    Activity Tolerance Patient tolerated treatment well;No increased pain    Behavior During Therapy [facility] for tasks assessed/performed           History reviewed. No pertinent past medical history.  Past Surgical History:  Procedure Laterality Date  . EYE SURGERY     For astigmatism     There were no vitals filed for this visit.   Subjective Assessment - 01/30/20 1532    Subjective I have had stabbing pain more on the Lt.  I have improved symptoms when walking, but sitting on the couch is stabbing pain.  I cannot stand more than 4-5 minutes.  I walked one block with my sister    Currently in Pain? Yes    Pain Score 5     Pain Location Back    Pain Orientation Medial              [facility] PT Assessment - 01/30/20 0001      Assessment   Medical Diagnosis Chronic Rt SI joint pain     Referring Provider (PT) Clare Gandy, MD      Strength   Overall Strength Comments Rt hip IR/ER 4+/5 MMT, Rt/Lt hip extension 4+/5 MMT, hip abduction 4/5 MMT       Transfers   Five time sit to stand comments  28 sec; stabbing pain in back                         [facility] Adult PT Treatment/Exercise - 01/30/20 0001      Lumbar Exercises: Aerobic   Nustep L2 x 10' PT present to discuss status      Lumbar Exercises: Seated   Sit to Stand 5 reps   2 sets      Modalities   Modalities Electrical Stimulation      Electrical Stimulation   Electrical Stimulation Location lumbar    Electrical Stimulation Action IFC    Electrical Stimulation Parameters to tolerance - 10 mA    Electrical Stimulation Goals Pain      Manual Therapy   Myofascial Release addaday to bilateral gluteals                    PT Short Term Goals - 12/27/19 1624      PT SHORT TERM GOAL #1   Title Pt will be independent with her initial HEP to improve pain and LE strength.    Status Achieved             PT Long Term Goals - 01/30/20 1544      PT LONG TERM GOAL #1   Title Pt will have improved hip strength to atleast 4+/5 MMT which will improve her efficiency with daily activity.  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    Baseline 5 max    Status On-going      PT LONG TERM GOAL #3   Title Pt will be able to complete 5x sit to stand in less than 20 sec without the need for UE support to reflect improvements in pain and strength with transitions.    Baseline 28 sec    Status On-going      PT LONG TERM GOAL #4   Title Pt wil be able to ascend and descend clinic steps with reciprocal pattern x3 trials, with no more than 3/10 discomfort.    Status On-going      PT LONG TERM GOAL #5   Title Pt will report atleast 60% improvement in her pain from the start of PT which will improve her quality of life.    Status Achieved                 Plan - 01/30/20 1727    Clinical Impression Statement Pt has demonstrated progress with hip strength. She is reporting feeling better overall yet her standing and walking is still very limited.  Pt continues to have muscle spasms and reports "stabbing" back pain. PT session focused on continued pain management and added estim.  She will benefit from follow  up to re-assess response to estim and to continue to work on core strength    PT Treatment/Interventions ADLs/Self Care Home Management;Aquatic Therapy;Cryotherapy;Electrical Stimulation;Moist Heat;Stair training;Gait training;Functional mobility training;Therapeutic activities;Balance training;Therapeutic exercise;Neuromuscular re-education;Manual techniques;Patient/family education;Passive range of motion;Dry needling;Spinal Manipulations;Taping    PT Next Visit Plan f/u on estim; core strength and diaphragmatic breathing; lumbar and gluteal fascial and trigger point release; start taping to lumbar as needed    PT Home Exercise Plan Access Code: [email or web address]    Consulted and Agree with Plan of Care Patient           Patient will benefit from skilled therapeutic intervention in order to improve the following deficits and impairments:  Abnormal gait, Decreased strength, Difficulty walking, Decreased activity tolerance, Hypomobility, Impaired flexibility, Increased muscle spasms, Postural dysfunction, Pain, Improper body mechanics  Visit Diagnosis: Chronic low back pain, unspecified back pain laterality, unspecified whether sciatica present  Other muscle spasm  Muscle weakness (generalized)     Problem List Patient Active Problem List   Diagnosis Date Noted  . 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 Coccydynia 05/07/2019    Junious Silk, PT 01/30/2020, 5:35 PM  [facility] [address] [place], [place], [address] Phone: [phone]   Fax:  [phone]  Name: Virginia Allen MRN: [id number] Date of Birth: [birth year]

## 2020-02-07 ENCOUNTER — Other Ambulatory Visit: Payer: Self-pay

## 2020-02-07 ENCOUNTER — Telehealth (INDEPENDENT_AMBULATORY_CARE_PROVIDER_SITE_OTHER): Payer: BC Managed Care – PPO | Admitting: Family Medicine

## 2020-02-07 DIAGNOSIS — R0981 Nasal congestion: Secondary | ICD-10-CM

## 2020-02-07 DIAGNOSIS — R059 Cough, unspecified: Secondary | ICD-10-CM | POA: Diagnosis not present

## 2020-02-07 DIAGNOSIS — R0789 Other chest pain: Secondary | ICD-10-CM

## 2020-02-07 DIAGNOSIS — R0602 Shortness of breath: Secondary | ICD-10-CM | POA: Diagnosis not present

## 2020-02-07 NOTE — Patient Instructions (Signed)
Seek in person evaluation at the nearby urgent care today.  If you are feeling worse or having severe symptoms, please contact 911 for transportation.   I hope you are feeling better soon!  It was nice to meet you today. I help [facility] out with telemedicine visits on Tuesdays and Thursdays and am available for visits on those days. If you have any concerns or questions following this visit please schedule a follow up visit with your Primary Care doctor or seek care at a local urgent care clinic to avoid delays in care.

## 2020-02-07 NOTE — Progress Notes (Signed)
Virtual Visit via Video Note  I connected with Virginia Allen  on 02/07/20 at  4:40 PM EST by a video enabled telemedicine application and verified that I am speaking with the correct person using two identifiers.  Location patient: home, [place] Location provider:work or home office Persons participating in the virtual visit: patient, provider  I discussed the limitations of evaluation and management by telemedicine and the availability of in person appointments. The patient expressed understanding and agreed to proceed.   HPI:  Acute telemedicine visit for  Sore throat: -Onset: 2 days ago -Symptoms include: sore throat, fever to 100-101, chills, nasal congestion, cough, malaise, HA - now today with chest pain in the center of her chest that she reports is severe at times and intermittent SOB -reports this scared her as she was hospitalized with covid at the onset of the pandemic -no known sick contacts, but teaches school -Denies: diarrhea, vomiting -Has tried:tried an over -Pertinent past medical history: used albuterol in the past -COVID-19 vaccine status: fully vaccinated and had covid in the past  ROS: See pertinent positives and negatives per HPI.  No past medical history on file.  Past Surgical History:  Procedure Laterality Date  . EYE SURGERY     For astigmatism      Current Outpatient Medications:  .  albuterol (VENTOLIN HFA) 108 (90 Base) MCG/ACT inhaler, Inhale 2 puffs into the lungs every 6 (six) hours as needed., Disp: 18 g, Rfl: 0 .  cephALEXin (KEFLEX) 500 MG capsule, Take 1 capsule (500 mg total) by mouth 2 (two) times daily., Disp: 20 capsule, Rfl: 0 .  Diclofenac Sodium (PENNSAID) 2 % SOLN, Place 1 application onto the skin 2 (two) times daily., Disp: 112 g, Rfl: 3 .  Levonorgestrel (LILETTA, 52 MG, IU), by Intrauterine route., Disp: , Rfl:  .  meclizine (ANTIVERT) 12.5 MG tablet, Take 1 tablet (12.5 mg total) by mouth 3 (three) times daily as needed for dizziness.,  Disp: 30 tablet, Rfl: 0 .  predniSONE (DELTASONE) 5 MG tablet, Take 6 pills for first day, 5 pills second day, 4 pills third day, 3 pills fourth day, 2 pills the fifth day, and 1 pill sixth day., Disp: 21 tablet, Rfl: 0  EXAM:  VITALS per patient if applicable:  GENERAL: alert, oriented, appears well and in no acute distress  HEENT: atraumatic, conjunttiva clear, no obvious abnormalities on inspection of external nose and ears  NECK: normal movements of the head and neck  LUNGS: on inspection no signs of respiratory distress, breathing rate appears normal, no obvious gross SOB, gasping or wheezing  CV: no obvious cyanosis  MS: moves all visible extremities without noticeable abnormality  PSYCH/NEURO: pleasant and cooperative, no obvious depression or anxiety, speech and thought processing grossly intact  ASSESSMENT AND PLAN:  Discussed the following assessment and plan:  Cough  SOB (shortness of breath)  Chest discomfort  Nasal congestion  -we discussed possible serious and likely etiologies, options for evaluation and workup, limitations of telemedicine visit vs in person visit, treatment, treatment risks and precautions. Pt prefers to treat via telemedicine empirically rather than in person at this moment.  Given the severity of her symptoms, rapid worsening and her history, recommended for her for a higher level of care with an in person evaluation this evening as likely will need flu testing, Covid testing and possible chest x-ray pending those results and an exam.  After lengthy discussion, she was agreeable to this and she reports her son can drive  her to a nearby urgent care.  She plans to go now to one of the 2 locations near her home.  She currently is not having chest pain or shortness of breath at the moment.  Advised to seek prompt in person care if worsening, new symptoms arise, or if is not improving with treatment. Discussed options for inperson care if PCP office  not available. Did let this patient know that I only do telemedicine on Tuesdays and Thursdays for [facility]. Advised to schedule follow up visit with PCP or [facility] if any further questions or concerns to avoid delays in care.   I discussed the assessment and treatment plan with the patient.  Over 20 minutes was spent on this encounter.  The patient was provided an opportunity to ask questions and all were answered. The patient agreed with the plan and demonstrated an understanding of the instructions.     Virginia Koyanagi, DO

## 2020-02-13 DIAGNOSIS — M25562 Pain in left knee: Secondary | ICD-10-CM | POA: Diagnosis not present

## 2020-02-13 DIAGNOSIS — M25561 Pain in right knee: Secondary | ICD-10-CM | POA: Diagnosis not present

## 2020-02-13 DIAGNOSIS — M545 Low back pain, unspecified: Secondary | ICD-10-CM | POA: Diagnosis not present

## 2020-02-13 DIAGNOSIS — M5416 Radiculopathy, lumbar region: Secondary | ICD-10-CM | POA: Diagnosis not present

## 2020-02-21 ENCOUNTER — Ambulatory Visit: Payer: BC Managed Care – PPO | Attending: Family Medicine | Admitting: Physical Therapy

## 2020-02-21 ENCOUNTER — Encounter: Payer: Self-pay | Admitting: Physical Therapy

## 2020-02-21 ENCOUNTER — Other Ambulatory Visit: Payer: Self-pay

## 2020-02-21 DIAGNOSIS — M6281 Muscle weakness (generalized): Secondary | ICD-10-CM | POA: Insufficient documentation

## 2020-02-21 DIAGNOSIS — G8929 Other chronic pain: Secondary | ICD-10-CM | POA: Diagnosis not present

## 2020-02-21 DIAGNOSIS — M545 Low back pain, unspecified: Secondary | ICD-10-CM | POA: Insufficient documentation

## 2020-02-21 DIAGNOSIS — M62838 Other muscle spasm: Secondary | ICD-10-CM | POA: Diagnosis not present

## 2020-02-21 NOTE — Therapy (Signed)
[facility] [address], [address] [place], [place], [address] Phone: [phone]   Fax:  [phone]  Physical Therapy Treatment  Patient Details  Name: Virginia Allen MRN: [id number] Date of Birth: [birth year] Referring Provider (PT): Clare Gandy, MD   Encounter Date: 02/21/2020   PT End of Session - 02/21/20 1625    Visit Number 13    Date for PT Re-Evaluation 03/12/20    Authorization Type BCBS PPO    PT Start Time 1615    PT Stop Time 1655    PT Time Calculation (min) 40 min    Activity Tolerance Patient tolerated treatment well;No increased pain    Behavior During Therapy [facility] for tasks assessed/performed           History reviewed. No pertinent past medical history.  Past Surgical History:  Procedure Laterality Date  . EYE SURGERY     For astigmatism     There were no vitals filed for this visit.   Subjective Assessment - 02/21/20 1627    Subjective I feel like I am not having the pain that I had    Patient Stated Goals improve pain with walking    Currently in Pain? Yes    Pain Score 4     Pain Location Back    Pain Orientation Medial    Pain Descriptors / Indicators Discomfort    Pain Type Chronic pain    Pain Onset More than a month ago                             [facility] Adult PT Treatment/Exercise - 02/21/20 0001      Lumbar Exercises: Aerobic   Nustep L2 x 10' PT present to discuss status      Manual Therapy   Myofascial Release Lt shoulder girdle musculature into cervical bilaterally - suboccipitals, upper traps, pecs perscap, lumbar paraspinals left                    PT Short Term Goals - 12/27/19 1624      PT SHORT TERM GOAL #1   Title Pt will be independent with her initial HEP to improve pain and LE strength.    Status Achieved             PT Long Term Goals - 01/30/20 1544      PT LONG TERM GOAL #1   Title Pt will have improved hip  strength to atleast 4+/5 MMT which will improve her efficiency with daily activity.    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    Baseline 5 max    Time 6    Period Weeks    Status On-going    Target Date 03/12/20      PT LONG TERM GOAL #3   Title Pt will be able to complete 5x sit to stand in less than 20 sec without the need for UE support to reflect improvements in pain and strength with transitions.    Baseline 28 sec    Status On-going      PT LONG TERM GOAL #4   Title Pt wil be able to ascend and descend clinic steps with reciprocal pattern x3 trials,  with no more than 3/10 discomfort.    Status On-going      PT LONG TERM GOAL #5   Title Pt will report atleast 60% improvement in her pain from the start of PT which will improve her quality of life.    Status Achieved                 Plan - 02/21/20 1702    Clinical Impression Statement Pt states she is feeling better overall.  She had one spasm that she noticed was related to a phone call that was stressful.  Pt was extremely tight around left shoulder girdle causing some movement restrictions in thoracic and tension in lumbar spine.  Today's session focused on release to the region to provide greater movement along spine.  Pt responded well and felt more release after treatment.    PT Treatment/Interventions ADLs/Self Care Home Management;Aquatic Therapy;Cryotherapy;Electrical Stimulation;Moist Heat;Stair training;Gait training;Functional mobility training;Therapeutic activities;Balance training;Therapeutic exercise;Neuromuscular re-education;Manual techniques;Patient/family education;Passive range of motion;Dry needling;Spinal Manipulations;Taping    PT Next Visit Plan core strength and stretches; add upper back and cervical stretches to continue overall spine mobility     PT Home Exercise Plan Access Code: [email or web address]    Consulted and Agree with Plan of Care Patient           Patient will benefit from skilled therapeutic intervention in order to improve the following deficits and impairments:  Abnormal gait, Decreased strength, Difficulty walking, Decreased activity tolerance, Hypomobility, Impaired flexibility, Increased muscle spasms, Postural dysfunction, Pain, Improper body mechanics  Visit Diagnosis: Chronic low back pain, unspecified back pain laterality, unspecified whether sciatica present  Other muscle spasm  Muscle weakness (generalized)     Problem List Patient Active Problem List   Diagnosis Date Noted  . 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 Coccydynia 05/07/2019    Junious Silk, PT 02/21/2020, 5:08 PM  [facility] [address], [address] [place], [place], [address] Phone: [phone]   Fax:  [phone]  Name: Ramaya Guile MRN: [id number] Date of Birth: November 18, 1970

## 2020-02-28 ENCOUNTER — Ambulatory Visit: Payer: BC Managed Care – PPO | Admitting: Physical Therapy

## 2020-02-28 ENCOUNTER — Encounter: Payer: Self-pay | Admitting: Physical Therapy

## 2020-02-28 ENCOUNTER — Other Ambulatory Visit: Payer: Self-pay

## 2020-02-28 DIAGNOSIS — M6281 Muscle weakness (generalized): Secondary | ICD-10-CM

## 2020-02-28 DIAGNOSIS — G8929 Other chronic pain: Secondary | ICD-10-CM

## 2020-02-28 DIAGNOSIS — M545 Low back pain, unspecified: Secondary | ICD-10-CM | POA: Diagnosis not present

## 2020-02-28 DIAGNOSIS — M62838 Other muscle spasm: Secondary | ICD-10-CM | POA: Diagnosis not present

## 2020-03-01 NOTE — Therapy (Addendum)
[facility] [address], [address] [place], [place], [address] Phone: [phone]   Fax:  [phone]  Physical Therapy Treatment  Patient Details  Name: Virginia Allen MRN: [id number] Date of Birth: [birth year] Referring Provider (PT): Clare Gandy, MD   Encounter Date: 02/28/2020   PT End of Session - 02/28/20 1631    Visit Number 14    Date for PT Re-Evaluation 03/12/20    Authorization Type BCBS PPO    PT Start Time 1627   arrived late   PT Stop Time 1706    PT Time Calculation (min) 39 min    Activity Tolerance Patient tolerated treatment well;No increased pain    Behavior During Therapy [facility] for tasks assessed/performed           History reviewed. No pertinent past medical history.  Past Surgical History:  Procedure Laterality Date  . EYE SURGERY     For astigmatism     There were no vitals filed for this visit.   Subjective Assessment - 03/01/20 1405    Subjective Pt states she feels like the pain is more centralized to one spot in the low back which is better, but still there and feels like she has to sit and stand slowly so she doesn't hurt her back.    Patient Stated Goals improve pain with walking    Currently in Pain? No/denies                             [facility] Adult PT Treatment/Exercise - 03/01/20 0001      Lumbar Exercises: Supine   Other Supine Lumbar Exercises thoracic rotation side lying 5x 10 sec    Other Supine Lumbar Exercises thoracic ext towel and pec stretch towel - 5 x 10 sec      Manual Therapy   Manual Therapy Joint mobilization    Joint Mobilization thoracic rotation mobs    Myofascial Release thoracic paraspinals                    PT Short Term Goals - 12/27/19 1624      PT SHORT TERM GOAL #1   Title Pt will be independent with her initial HEP to improve pain and LE strength.    Status Achieved             PT Long Term Goals -  01/30/20 1544      PT LONG TERM GOAL #1   Title Pt will have improved hip strength to atleast 4+/5 MMT which will improve her efficiency with daily activity.    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    Baseline 5 max    Time 6    Period Weeks    Status On-going    Target Date 03/12/20      PT LONG TERM GOAL #3   Title Pt will be able to complete 5x sit to stand in less than 20 sec without the need for UE support to reflect improvements in pain and strength with transitions.    Baseline 28 sec    Status On-going      PT LONG TERM GOAL #4   Title Pt wil be able to ascend and  descend clinic steps with reciprocal pattern x3 trials, with no more than 3/10 discomfort.    Status On-going      PT LONG TERM GOAL #5   Title Pt will report atleast 60% improvement in her pain from the start of PT which will improve her quality of life.    Status Achieved                 Plan - 03/01/20 1403    Clinical Impression Statement Pt continues to have tension and hypomobility in thoracic spine up through cervical and shoulder musculature.  Today's session focused on thoracic mobility so greater movement will allow for greater muscle activation.  This is expected to  translate in deep core muscles will be able to activate more effectively for trunk stability will less lumbar muscle spasms.  Pt will benefit from skilled PT to continue progressing towards functional goals and pain management.    PT Treatment/Interventions ADLs/Self Care Home Management;Aquatic Therapy;Cryotherapy;Electrical Stimulation;Moist Heat;Stair training;Gait training;Functional mobility training;Therapeutic activities;Balance training;Therapeutic exercise;Neuromuscular re-education;Manual techniques;Patient/family education;Passive range of motion;Dry needling;Spinal  Manipulations;Taping    PT Next Visit Plan core strength and stretches; add upper back and cervical stretches to continue overall spine mobility    PT Home Exercise Plan Access Code: [email or web address]    Consulted and Agree with Plan of Care Patient           Patient will benefit from skilled therapeutic intervention in order to improve the following deficits and impairments:  Abnormal gait,Decreased strength,Difficulty walking,Decreased activity tolerance,Hypomobility,Impaired flexibility,Increased muscle spasms,Postural dysfunction,Pain,Improper body mechanics  Visit Diagnosis: Chronic low back pain, unspecified back pain laterality, unspecified whether sciatica present  Other muscle spasm  Muscle weakness (generalized)     Problem List Patient Active Problem List   Diagnosis Date Noted  . 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 Coccydynia 05/07/2019    Junious Silk, PT 03/01/2020, 2:15 PM  [facility] [address] [place], [place], [address] Phone: [phone]   Fax:  [phone]  Name: Virginia Allen MRN: [id number] Date of Birth: 03/29/1970

## 2020-03-05 ENCOUNTER — Ambulatory Visit: Payer: BC Managed Care – PPO | Admitting: Physical Therapy

## 2020-03-05 ENCOUNTER — Encounter: Payer: Self-pay | Admitting: Physical Therapy

## 2020-03-05 ENCOUNTER — Other Ambulatory Visit: Payer: Self-pay

## 2020-03-05 DIAGNOSIS — M545 Low back pain, unspecified: Secondary | ICD-10-CM

## 2020-03-05 DIAGNOSIS — M62838 Other muscle spasm: Secondary | ICD-10-CM | POA: Diagnosis not present

## 2020-03-05 DIAGNOSIS — G8929 Other chronic pain: Secondary | ICD-10-CM | POA: Diagnosis not present

## 2020-03-05 DIAGNOSIS — M6281 Muscle weakness (generalized): Secondary | ICD-10-CM | POA: Diagnosis not present

## 2020-03-05 NOTE — Therapy (Addendum)
[facility] [address] [place], [place], [address] Phone: [phone]   Fax:  [phone]  Physical Therapy Treatment  Patient Details  Name: Taheerah Guldin MRN: [id number] Date of Birth: 06-19-1970 Referring Provider (PT): Clearance Coots, MD   Encounter Date: 03/05/2020   PT End of Session - 03/05/20 1624    Visit Number 15    Date for PT Re-Evaluation 03/12/20    Authorization Type [employer] PPO    Authorization - Visit Number 13         time in: 7106 Time out: 1701 Total time 45 min  History reviewed. No pertinent past medical history.  Past Surgical History:  Procedure Laterality Date  . EYE SURGERY     For astigmatism     There were no vitals filed for this visit.   Subjective Assessment - 03/05/20 1621    Subjective I walked a lot on Saturday and since then having a lot of pain. I feel it more on the left side    Limitations Sitting;Standing;House hold activities;Walking    How long can you walk comfortably? avoids walking    Currently in Pain? Yes    Pain Score 9    9.8   Pain Location Back    Pain Orientation Medial;Left    Pain Descriptors / Indicators Sharp    Pain Type Chronic pain    Pain Onset More than a month ago    Pain Frequency Intermittent    Aggravating Factors  walking in the store    Multiple Pain Sites No                             [facility] Adult PT Treatment/Exercise - 03/05/20 0001      Lumbar Exercises: Stretches   Press Ups --   5 min   Prone Mid Back Stretch 5 reps    Other Lumbar Stretch Exercise standing lateral shift - increaed pain going to Rt side; felt slight improvement going left      Lumbar Exercises: Aerobic   UBE (Upper Arm Bike) L1 4 min fwd; 2 min back      Manual Therapy   Joint Mobilization lumbar rotation mobs in sidelying - 3 x 30 sec; ext mobs with press ups                    PT Short Term Goals - 12/27/19  1624      PT SHORT TERM GOAL #1   Title Pt will be independent with her initial HEP to improve pain and LE strength.    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  However, she has had very severe  pain since that time.  Today's focus was reduce muscle spasms and work on improved trunk mobility. Pt responded well to lumbar ext with prone press up.  Lateral shift is significant and did not improve with lateral shifts at wall. Observable is Trendelenburg on the Lt side. Pt did respond well to lumbar rotation mobilization in side lying.  She was given press up and trunk rotation to maintain imrpoved movement to work on at home.  Pt will benefit from skilled PT to continue to work on posture and pain management    PT Treatment/Interventions ADLs/Self Care Home Management;Aquatic Therapy;Cryotherapy;Electrical Stimulation;Moist Heat;Stair training;Gait training;Functional mobility training;Therapeutic activities;Balance training;Therapeutic exercise;Neuromuscular re-education;Manual techniques;Patient/family education;Passive range of motion;Dry needling;Spinal Manipulations;Taping    PT Next Visit Plan f/u on press ups and lateral shift; continue working on overall spine mobility; rotation mobs in sidelying    PT Home Exercise Plan Access Code: [email or web address]    Consulted and Agree with Plan of Care Patient           Patient will benefit from skilled therapeutic intervention in order to improve the  following deficits and impairments:  Abnormal gait,Decreased strength,Difficulty walking,Decreased activity tolerance,Hypomobility,Impaired flexibility,Increased muscle spasms,Postural dysfunction,Pain,Improper body mechanics  Visit Diagnosis: Chronic low back pain, unspecified back pain laterality, unspecified whether sciatica present  Other muscle spasm  Muscle weakness (generalized)     Problem List Patient Active Problem List   Diagnosis Date Noted  . 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 Coccydynia 05/07/2019    Jule Ser, PT 03/05/2020, 5:17 PM  [facility] [address], [place], [place], [address] Phone: [phone]   Fax:  [phone]  Name: Mahina Salatino MRN: [id number] Date of Birth: 03-19-1971  PHYSICAL THERAPY DISCHARGE SUMMARY  Visits from Start of Care: 15  Current functional level related to goals / functional outcomes: See above details   Remaining deficits: See above   Education / Equipment: HEP  Plan: Patient agrees to discharge.  Patient goals were not met. Patient is being discharged due to not returning since the last visit.  ?????     [employer], PT 05/19/20 12:16 PM

## 2020-03-12 ENCOUNTER — Ambulatory Visit: Payer: BC Managed Care – PPO

## 2020-05-17 DIAGNOSIS — M25561 Pain in right knee: Secondary | ICD-10-CM | POA: Diagnosis not present

## 2020-05-17 DIAGNOSIS — M545 Low back pain, unspecified: Secondary | ICD-10-CM | POA: Diagnosis not present

## 2020-05-17 DIAGNOSIS — M5416 Radiculopathy, lumbar region: Secondary | ICD-10-CM | POA: Diagnosis not present

## 2020-05-17 DIAGNOSIS — M25562 Pain in left knee: Secondary | ICD-10-CM | POA: Diagnosis not present

## 2020-05-19 DIAGNOSIS — M545 Low back pain, unspecified: Secondary | ICD-10-CM | POA: Diagnosis not present

## 2020-05-19 DIAGNOSIS — M25561 Pain in right knee: Secondary | ICD-10-CM | POA: Diagnosis not present

## 2020-05-19 DIAGNOSIS — M25562 Pain in left knee: Secondary | ICD-10-CM | POA: Diagnosis not present

## 2020-05-19 DIAGNOSIS — M5416 Radiculopathy, lumbar region: Secondary | ICD-10-CM | POA: Diagnosis not present

## 2020-12-26 ENCOUNTER — Ambulatory Visit: Payer: BC Managed Care – PPO | Admitting: Internal Medicine

## 2021-01-02 ENCOUNTER — Ambulatory Visit (HOSPITAL_BASED_OUTPATIENT_CLINIC_OR_DEPARTMENT_OTHER)
Admission: RE | Admit: 2021-01-02 | Discharge: 2021-01-02 | Disposition: A | Discharge status: Home / Self Care | Payer: 59 | Source: Ambulatory Visit | Attending: Medical | Admitting: Medical

## 2021-01-02 ENCOUNTER — Other Ambulatory Visit: Payer: Self-pay

## 2021-01-02 ENCOUNTER — Ambulatory Visit: Payer: 59 | Admitting: Medical

## 2021-01-02 VITALS — BP 120/60 | HR 72 | Temp 98.4°F | Resp 18 | Ht 63.0 in | Wt 201.2 lb

## 2021-01-02 DIAGNOSIS — M533 Sacrococcygeal disorders, not elsewhere classified: Secondary | ICD-10-CM

## 2021-01-02 DIAGNOSIS — R058 Other specified cough: Secondary | ICD-10-CM

## 2021-01-02 DIAGNOSIS — R5383 Other fatigue: Secondary | ICD-10-CM

## 2021-01-02 NOTE — Addendum Note (Signed)
Addended by: Rosita Kea on: 01/02/2021 03:30 PM   Modules accepted: Orders

## 2021-01-02 NOTE — Patient Instructions (Signed)
Recently diagnosed with pneumonia and treated with doxycycline.  Overall much better but having residual rare cough and fatigue.  Will get chest x-ray today to see if any pneumonia present.  Recent onset of coccyx region pain on standing and sitting.  No trauma or fall reported.  On exam of area no skin infection or pilonidal cyst.  Recommend either low-dose ibuprofen and/or Tylenol for pain pending x-ray results.  Recent fatigue and weight gain.  We will get CBC, CMP, TSH, T4, B12, B1 and vitamin D level.   Follow-up date to be determined after lab and imaging review.

## 2021-01-02 NOTE — Addendum Note (Signed)
Addended by: Gwenevere Abbot on: 01/02/2021 03:51 PM   Modules accepted: Orders

## 2021-01-02 NOTE — Progress Notes (Signed)
Subjective:    Patient ID: Virginia Allen, female    DOB: [birth year], 50 y.o.   MRN: [id number]  HPI  Pt about 3 weeks ago had pneumonia. Pt had cough, nasl congestion, runny nose and  some chest congestion back then. Pt had some costochondritis pain anterior chest back then when breathing. Negative covid test.   Pt states no cxr done. Provider listened to lungs and told her she told pneumonia. Pt given doxycycline and benzonatate.  No only residual cough and fatigue.  Also has some coccyx pain recently. Pain sitting and when she stands.  Pt states has been fatigue, feels like of focus and gaining weight.      Review of Systems  Constitutional:  Negative for chills and fever.  HENT:  Negative for congestion and ear discharge.   Respiratory:  Negative for cough, chest tightness, shortness of breath and wheezing.   Cardiovascular:  Negative for chest pain and palpitations.  Gastrointestinal:  Negative for abdominal pain.  Musculoskeletal:  Negative for back pain.       Coccyx pain    No past medical history on file.   Social History   Socioeconomic History   Marital status: Single    Spouse name: Not on file   Number of children: Not on file   Years of education: Not on file   Highest education level: Not on file  Occupational History   Not on file  Tobacco Use   Smoking status: Never   Smokeless tobacco: Never  Substance and Sexual Activity   Alcohol use: Never    Comment: rare   Drug use: Never   Sexual activity: Yes    Birth control/protection: None  Other Topics Concern   Not on file  Social History Narrative   Not on file   Social Determinants of Health   Financial Resource Strain: Not on file  Food Insecurity: Not on file  Transportation Needs: Not on file  Physical Activity: Not on file  Stress: Not on file  Social Connections: Not on file  Intimate Partner Violence: Not on file    Past Surgical History:  Procedure Laterality Date    EYE SURGERY     For astigmatism     Family History  Problem Relation Age of Onset   Hypertension Father    Cancer Father    Hypertension Mother     No Known Allergies  Current Outpatient Medications on File Prior to Visit  Medication Sig Dispense Refill   albuterol (VENTOLIN HFA) 108 (90 Base) MCG/ACT inhaler Inhale 2 puffs into the lungs every 6 (six) hours as needed. 18 g 0   Diclofenac Sodium (PENNSAID) 2 % SOLN Place 1 application onto the skin 2 (two) times daily. 112 g 3   Levonorgestrel (LILETTA, 52 MG, IU) by Intrauterine route.     meclizine (ANTIVERT) 12.5 MG tablet Take 1 tablet (12.5 mg total) by mouth 3 (three) times daily as needed for dizziness. 30 tablet 0   predniSONE (DELTASONE) 5 MG tablet Take 6 pills for first day, 5 pills second day, 4 pills third day, 3 pills fourth day, 2 pills the fifth day, and 1 pill sixth day. 21 tablet 0   benzonatate (TESSALON) 100 MG capsule Take 100 mg by mouth 3 (three) times daily.     No current facility-administered medications on file prior to visit.    BP 120/60 (BP Location: Left Arm, Patient Position: Sitting, Cuff Size: Large)   Pulse 72  Temp 98.4 F (36.9 C) (Oral)   Resp 18   Ht 5\' 3"  (1.6 m)   Wt 201 lb 3.2 oz (91.3 kg)   SpO2 97%   BMI 35.64 kg/m       Objective:   Physical Exam  General Mental Status- Alert. General Appearance- Not in acute distress.   Skin General: Color- Normal Color. Moisture- Normal Moisture.  Neck Carotid Arteries- Normal color. Moisture- Normal Moisture. No carotid bruits. No JVD.  Chest and Lung Exam Auscultation: Breath Sounds:-Normal.  Cardiovascular Auscultation:Rythm- Regular. Murmurs & Other Heart Sounds:Auscultation of the heart reveals- No Murmurs.  Abdomen Inspection:-Inspeection Normal. Palpation/Percussion:Note:No mass. Palpation and Percussion of the abdomen reveal- Non Tender, Non Distended + BS, no rebound or guarding.    Neurologic Cranial Nerve exam:-  CN III-XII intact(No nystagmus), symmetric smile. Strength:- 5/5 equal and symmetric strength both upper and lower extremities.   Lower back/coccyx.-On inspection with medical assistant present coccyx and upper buttocks region appears normal.  No obvious pain directly on palpation though she is does describe coccyx area as source of pain when she stands and sits.    Assessment & Plan:   Patient Instructions  Recently diagnosed with pneumonia and treated with doxycycline.  Overall much better but having residual rare cough and fatigue.  Will get chest x-ray today to see if any pneumonia present.  Recent onset of coccyx region pain on standing and sitting.  No trauma or fall reported.  On exam of area no skin infection or pilonidal cyst.  Recommend either low-dose ibuprofen and/or Tylenol for pain pending x-ray results.  Recent fatigue and weight gain.  We will get CBC, CMP, TSH, T4, B12, B1 and vitamin D level.   Follow-up date to be determined after lab and imaging review.   , PA-C

## 2021-01-07 ENCOUNTER — Telehealth: Payer: Self-pay | Admitting: Medical

## 2021-01-07 DIAGNOSIS — M533 Sacrococcygeal disorders, not elsewhere classified: Secondary | ICD-10-CM

## 2021-01-07 LAB — COMPREHENSIVE METABOLIC PANEL
AG Ratio: 1.3 (calc) (ref 1.0–2.5)
ALT: 25 U/L (ref 6–29)
AST: 18 U/L (ref 10–35)
Albumin: 4 g/dL (ref 3.6–5.1)
Alkaline phosphatase (APISO): 84 U/L (ref 37–153)
BUN: 18 mg/dL (ref 7–25)
CO2: 23 mmol/L (ref 20–32)
Calcium: 9 mg/dL (ref 8.6–10.4)
Chloride: 106 mmol/L (ref 98–110)
Creat: 0.7 mg/dL (ref 0.50–1.03)
Globulin: 3.1 g/dL (calc) (ref 1.9–3.7)
Glucose, Bld: 81 mg/dL (ref 65–99)
Potassium: 3.9 mmol/L (ref 3.5–5.3)
Sodium: 140 mmol/L (ref 135–146)
Total Bilirubin: 0.2 mg/dL (ref 0.2–1.2)
Total Protein: 7.1 g/dL (ref 6.1–8.1)

## 2021-01-07 LAB — CBC WITH DIFFERENTIAL/PLATELET
Absolute Monocytes: 556 cells/uL (ref 200–950)
Basophils Absolute: 71 cells/uL (ref 0–200)
Basophils Relative: 0.7 %
Eosinophils Absolute: 172 cells/uL (ref 15–500)
Eosinophils Relative: 1.7 %
HCT: 39.7 % (ref 35.0–45.0)
Hemoglobin: 13.1 g/dL (ref 11.7–15.5)
Lymphs Abs: 3060 cells/uL (ref 850–3900)
MCH: 28 pg (ref 27.0–33.0)
MCHC: 33 g/dL (ref 32.0–36.0)
MCV: 84.8 fL (ref 80.0–100.0)
MPV: 11.7 fL (ref 7.5–12.5)
Monocytes Relative: 5.5 %
Neutro Abs: 6242 cells/uL (ref 1500–7800)
Neutrophils Relative %: 61.8 %
Platelets: 294 10*3/uL (ref 140–400)
RBC: 4.68 10*6/uL (ref 3.80–5.10)
RDW: 12.9 % (ref 11.0–15.0)
Total Lymphocyte: 30.3 %
WBC: 10.1 10*3/uL (ref 3.8–10.8)

## 2021-01-07 LAB — TSH: TSH: 2.65 mIU/L

## 2021-01-07 LAB — VITAMIN D 1,25 DIHYDROXY
Vitamin D 1, 25 (OH)2 Total: 43 pg/mL (ref 18–72)
Vitamin D2 1, 25 (OH)2: <8 pg/mL
Vitamin D3 1, 25 (OH)2: 43 pg/mL

## 2021-01-07 LAB — T4, FREE: Free T4: 1.1 ng/dL (ref 0.8–1.8)

## 2021-01-07 LAB — VITAMIN B12: Vitamin B-12: 482 pg/mL (ref 200–1100)

## 2021-01-07 MED ORDER — PREDNISONE 10 MG (21) PO TBPK: ORAL_TABLET | ORAL | 0 refills | Status: DC

## 2021-01-07 NOTE — Telephone Encounter (Signed)
Chart opened to rx med, order lab, review chart, respond to my chart message or send message to staff member  

## 2021-01-09 LAB — VITAMIN B1: Vitamin B1 (Thiamine): 8 nmol/L (ref 8–30)

## 2021-01-13 ENCOUNTER — Other Ambulatory Visit: Payer: Self-pay

## 2021-01-13 ENCOUNTER — Ambulatory Visit (INDEPENDENT_AMBULATORY_CARE_PROVIDER_SITE_OTHER): Payer: 59 | Admitting: Sports Medicine

## 2021-01-13 VITALS — BP 122/76 | HR 74 | Ht 63.0 in | Wt 199.0 lb

## 2021-01-13 DIAGNOSIS — M533 Sacrococcygeal disorders, not elsewhere classified: Secondary | ICD-10-CM

## 2021-01-13 MED ORDER — MELOXICAM 15 MG PO TABS: 15.0000 mg | ORAL_TABLET | Freq: Every day | ORAL | 0 refills | Status: DC

## 2021-01-13 NOTE — Patient Instructions (Addendum)
Meloxicam 15mg  daily 2 weeks Follow up as needed Recommend that you sit on donut pillow at all times

## 2021-01-13 NOTE — Progress Notes (Signed)
Virginia Allen D.[facility] [address] [place] [address] Phone: [phone]   Assessment and Plan:     1. Coccydynia -Chronic with exacerbation, initial sports medicine visit - Likely coccydynia and acute flare based on HPI, physical exam, prior improvement with geared physical therapy towards coccydynia - Recommend starting meloxicam 15 mg daily x2 weeks - Use donut pillow whenever sitting - Continue home exercises that patient was taught during physical therapy   Pertinent previous records reviewed include sacral x-ray   Follow Up: In 2 to 4 weeks if no improvement or worsening of symptoms.   Subjective:   I, Virginia Allen, am serving as a Neurosurgeon for Dr. Richardean Sale.  Chief Complaint: coccyx pain   HPI:   01/13/21 Patient is a 50 year old female presenting with coccyx pain.No MOI. Patien thad imaging done of Coccyx on 01/02/21 and referred to sports medicine for evaluation and treatment. PT a year ago to help with walking and standing. Standing for more than 5 mins and walking a lot she gets pain in hips. Sharp pain near coccyx when sitting and laying on back has to lay on side. Pain started during PT a year ago and radiates into butt and thighs  Denies saddle paresthesia, urinary incontinence, change in bowel movements.  Pain does not change based off of bowel movements.  Aggravates: sitting and standing Treatments tried: Topical NSAID, tylenol,    Relevant Historical Information: None pertinent  Additional pertinent review of systems negative.   Current Outpatient Medications:    meloxicam (MOBIC) 15 MG tablet, Take 1 tablet (15 mg total) by mouth daily., Disp: 14 tablet, Rfl: 0   albuterol (VENTOLIN HFA) 108 (90 Base) MCG/ACT inhaler, Inhale 2 puffs into the lungs every 6 (six) hours as needed., Disp: 18 g, Rfl: 0   benzonatate (TESSALON) 100 MG capsule, Take 100 mg by mouth 3 (three) times daily., Disp: , Rfl:     Diclofenac Sodium (PENNSAID) 2 % SOLN, Place 1 application onto the skin 2 (two) times daily., Disp: 112 g, Rfl: 3   Levonorgestrel (LILETTA, 52 MG, IU), by Intrauterine route., Disp: , Rfl:    meclizine (ANTIVERT) 12.5 MG tablet, Take 1 tablet (12.5 mg total) by mouth 3 (three) times daily as needed for dizziness., Disp: 30 tablet, Rfl: 0   predniSONE (DELTASONE) 5 MG tablet, Take 6 pills for first day, 5 pills second day, 4 pills third day, 3 pills fourth day, 2 pills the fifth day, and 1 pill sixth day., Disp: 21 tablet, Rfl: 0   predniSONE (STERAPRED UNI-PAK 21 TAB) 10 MG (21) TBPK tablet, Taper over 6 days., Disp: 21 tablet, Rfl: 0   Objective:     Vitals:   01/13/21 1539  BP: 122/76  Pulse: 74  SpO2: 97%  Weight: 199 lb (90.3 kg)  Height: 5\' 3"  (1.6 m)      Body mass index is 35.25 kg/m.    Physical Exam:    Gen: Appears well, nad, nontoxic and pleasant Psych: Alert and oriented, appropriate mood and affect Neuro: sensation intact, strength is 5/5 in upper and lower extremities, muscle tone wnl Skin: no susupicious lesions or rashes  Back - Normal skin, Spine with normal alignment and no deformity.   No tenderness to vertebral process palpation.   Paraspinous muscles are not tender and without spasm Straight leg raise negative Trendelenberg negative  Negative piriformis test Moderate TTP at sacral ILA  Electronically signed by:   Leo Rod.[facility] 4:35 PM 01/13/21

## 2021-02-05 ENCOUNTER — Other Ambulatory Visit (HOSPITAL_COMMUNITY)
Admission: RE | Admit: 2021-02-05 | Discharge: 2021-02-05 | Disposition: A | Discharge status: Home / Self Care | Payer: 59 | Source: Ambulatory Visit | Attending: Family Medicine | Admitting: Family Medicine

## 2021-02-05 ENCOUNTER — Encounter: Payer: Self-pay | Admitting: Family Medicine

## 2021-02-05 ENCOUNTER — Other Ambulatory Visit: Payer: Self-pay

## 2021-02-05 ENCOUNTER — Ambulatory Visit (INDEPENDENT_AMBULATORY_CARE_PROVIDER_SITE_OTHER): Payer: 59 | Admitting: Family Medicine

## 2021-02-05 VITALS — BP 117/55 | HR 69 | Wt 201.0 lb

## 2021-02-05 DIAGNOSIS — Z30432 Encounter for removal of intrauterine contraceptive device: Secondary | ICD-10-CM

## 2021-02-05 DIAGNOSIS — Z01419 Encounter for gynecological examination (general) (routine) without abnormal findings: Secondary | ICD-10-CM | POA: Insufficient documentation

## 2021-02-05 DIAGNOSIS — Z1231 Encounter for screening mammogram for malignant neoplasm of breast: Secondary | ICD-10-CM | POA: Diagnosis not present

## 2021-02-05 NOTE — Progress Notes (Signed)
Patient states she has been dealing with pain in her tailbone. Patient states she received care at chiropractor who told her the IUD is no in right position. Armandina Stammer RN

## 2021-02-05 NOTE — Progress Notes (Signed)
GYNECOLOGY ANNUAL PREVENTATIVE CARE ENCOUNTER NOTE  Subjective:   Virginia Allen is a 50 y.o. G32P1001 female here for a routine annual gynecologic exam.  Current complaints: Pelvic cramping and bleeding with periods. Didn't have this before the IUD. Would like IUD removed. Having some vasomotor sxs and mood irritation. Not currently sexually active. Denies abnormal vaginal bleeding, discharge, problems with intercourse or other gynecologic concerns.    Gynecologic History No LMP recorded. Patient has had an implant. Patient is not sexually active  Contraception: IUD Last Pap: 2019. Results were: normal Last mammogram: 2019. Results were: normal Colorectal Cancer Screening: none  Obstetric History OB History  Gravida Para Term Preterm AB Living  1 1 1     1   SAB IAB Ectopic Multiple Live Births          1    # Outcome Date GA Lbr Len/2nd Weight Sex Delivery Anes PTL Lv  1 Term 2002 [redacted]w[redacted]d   M Vag-Spont EPI  LIV    History reviewed. No pertinent past medical history.  Past Surgical History:  Procedure Laterality Date   EYE SURGERY     For astigmatism     Current Outpatient Medications on File Prior to Visit  Medication Sig Dispense Refill   Levonorgestrel (LILETTA, 52 MG, IU) by Intrauterine route.     albuterol (VENTOLIN HFA) 108 (90 Base) MCG/ACT inhaler Inhale 2 puffs into the lungs every 6 (six) hours as needed. (Patient not taking: Reported on 02/05/2021) 18 g 0   benzonatate (TESSALON) 100 MG capsule Take 100 mg by mouth 3 (three) times daily. (Patient not taking: Reported on 02/05/2021)     Diclofenac Sodium (PENNSAID) 2 % SOLN Place 1 application onto the skin 2 (two) times daily. (Patient not taking: Reported on 02/05/2021) 112 g 3   meclizine (ANTIVERT) 12.5 MG tablet Take 1 tablet (12.5 mg total) by mouth 3 (three) times daily as needed for dizziness. (Patient not taking: Reported on 02/05/2021) 30 tablet 0   meloxicam (MOBIC) 15 MG tablet Take 1 tablet  (15 mg total) by mouth daily. (Patient not taking: Reported on 02/05/2021) 14 tablet 0   predniSONE (DELTASONE) 5 MG tablet Take 6 pills for first day, 5 pills second day, 4 pills third day, 3 pills fourth day, 2 pills the fifth day, and 1 pill sixth day. (Patient not taking: Reported on 02/05/2021) 21 tablet 0   predniSONE (STERAPRED UNI-PAK 21 TAB) 10 MG (21) TBPK tablet Taper over 6 days. (Patient not taking: Reported on 02/05/2021) 21 tablet 0   No current facility-administered medications on file prior to visit.    No Known Allergies  Social History   Socioeconomic History   Marital status: Single    Spouse name: Not on file   Number of children: Not on file   Years of education: Not on file   Highest education level: Not on file  Occupational History   Not on file  Tobacco Use   Smoking status: Never   Smokeless tobacco: Never  Vaping Use   Vaping Use: Never used  Substance and Sexual Activity   Alcohol use: Never    Comment: rare   Drug use: Never   Sexual activity: Yes    Birth control/protection: I.U.D.  Other Topics Concern   Not on file  Social History Narrative   Not on file   Social Determinants of Health   Financial Resource Strain: Not on file  Food Insecurity: Not on file  Transportation  Needs: Not on file  Physical Activity: Not on file  Stress: Not on file  Social Connections: Not on file  Intimate Partner Violence: Not on file    Family History  Problem Relation Age of Onset   Hypertension Father    Cancer Father    Hypertension Mother     The following portions of the patient's history were reviewed and updated as appropriate: allergies, current medications, past family history, past medical history, past social history, past surgical history and problem list.  Review of Systems Pertinent items are noted in HPI.   Objective:  BP (!) 117/55   Pulse 69   Wt 201 lb (91.2 kg)   BMI 35.61 kg/m  Wt Readings from Last 3 Encounters:   02/05/21 201 lb (91.2 kg)  01/13/21 199 lb (90.3 kg)  01/02/21 201 lb 3.2 oz (91.3 kg)     Chaperone present during exam  CONSTITUTIONAL: Well-developed, well-nourished female in no acute distress.  HENT:  Normocephalic, atraumatic, External right and left ear normal. Oropharynx is clear and moist EYES: Conjunctivae and EOM are normal. Pupils are equal, round, and reactive to light. No scleral icterus.  NECK: Normal range of motion, supple, no masses.  Normal thyroid.   CARDIOVASCULAR: Normal heart rate noted, regular rhythm RESPIRATORY: Clear to auscultation bilaterally. Effort and breath sounds normal, no problems with respiration noted. BREASTS: Symmetric in size. No masses, skin changes, nipple drainage, or lymphadenopathy. ABDOMEN: Soft, normal bowel sounds, no distention noted.  No tenderness, rebound or guarding.  PELVIC: Normal appearing external genitalia; normal appearing vaginal mucosa and cervix.  No abnormal discharge noted. . MUSCULOSKELETAL: Normal range of motion. No tenderness.  No cyanosis, clubbing, or edema.  2+ distal pulses. SKIN: Skin is warm and dry. No rash noted. Not diaphoretic. No erythema. No pallor. NEUROLOGIC: Alert and oriented to person, place, and time. Normal reflexes, muscle tone coordination. No cranial nerve deficit noted. PSYCHIATRIC: Normal mood and affect. Normal behavior. Normal judgment and thought content.  IUD Removal  Patient was in the dorsal lithotomy position, normal external genitalia was noted.  A speculum was placed in the patient's vagina, normal discharge was noted, no lesions. The multiparous cervix was visualized, no lesions, no abnormal discharge,  and was swabbed with Betadine using scopettes.  The strings of the IUD was grasped and pulled using ring forceps.  The IUD was successfully removed in its entirety.  Patient tolerated the procedure well.      Assessment:  Annual gynecologic examination with pap smear   Plan:  1. Well  Woman Exam Will follow up results of pap smear and manage accordingly. Mammogram scheduled  - Cytology - PAP( [facility])  2. Encounter for screening mammogram for malignant neoplasm of breast - [facility] Digital Screening; Future  3. Encounter for IUD removal IUD removed as above. Will see if dysmenorrhea and HMB improves. F/u in 3 months    Candelaria Celeste, DO [facility]

## 2021-02-09 ENCOUNTER — Ambulatory Visit: Payer: 59 | Admitting: Family

## 2021-02-09 ENCOUNTER — Other Ambulatory Visit: Payer: Self-pay

## 2021-02-09 VITALS — BP 132/69 | HR 106 | Temp 101.7°F | Resp 16 | Ht 65.0 in | Wt 199.0 lb

## 2021-02-09 DIAGNOSIS — J111 Influenza due to unidentified influenza virus with other respiratory manifestations: Secondary | ICD-10-CM | POA: Diagnosis not present

## 2021-02-09 MED ORDER — BENZONATATE 100 MG PO CAPS: 100.0000 mg | ORAL_CAPSULE | Freq: Two times a day (BID) | ORAL | 0 refills | Status: AC | PRN

## 2021-02-09 MED ORDER — LIDOCAINE VISCOUS HCL 2 % MT SOLN: 15.0000 mL | Freq: Four times a day (QID) | OROMUCOSAL | 0 refills | Status: AC | PRN

## 2021-02-09 NOTE — Patient Instructions (Signed)
For sore throat/body aches- you may use ibuprofen 400mg  every 6 hours or tylenol 650mg  every 6 hours. You may also use the viscous lidocaine every 6 hrs as needed for sore throat. For cough, you may use tessalon every 8 hrs as needed. Call if new/worsening symptoms or if symptoms are not improved in 3-4 days.

## 2021-02-09 NOTE — Assessment & Plan Note (Signed)
Hx most consistent with influenza infection. Unfortunately, we are out of rapid flu tests. She is outside of the window for Tamiflu treatment anyway, so it would not change management.  We discussed treatment plan as follows:  For sore throat/body aches- you may use ibuprofen 400mg  every 6 hours or tylenol 650mg  every 6 hours. You may also use the viscous lidocaine every 6 hrs as needed for sore throat. For cough, you may use tessalon every 8 hrs as needed. Call if new/worsening symptoms or if symptoms are not improved in 3-4 days.

## 2021-02-09 NOTE — Progress Notes (Signed)
Subjective:   By signing my name below, I, Shehryar Baig, attest that this documentation has been prepared under the direction and in the presence of Sandford Craze NP. 02/09/2021      Patient ID: Virginia Allen, female    DOB: 27-Feb-1971, 50 y.o.   MRN: [id number]  Chief Complaint  Patient presents with   Fever    Complains of temperature, as high as 102.9   Generalized Body Aches    Complains of body aches and headaches   Sore Throat    Complains of sore throat    Fever  Associated symptoms include coughing and a sore throat.  Sore Throat  Associated symptoms include coughing.  Patient is in today for a office visit.   Sore throat- She complains of sore throat since Friday afternoon on 02/06/2021. Later that day her sore throat worsened, she developed fever and body aches, and her voice became hoarse. She was taking OTC cough medication and tylenol and found no improvement in her symptoms. She was given amoxicillin by another provider to manage her symptoms but found no improvement so far. She reports her fever was 102 degrees on Saturday. She continues having a sore throat and reports it is feeling worse than before. She is occasionally coughing. 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 She is requesting a note to excuse her from work.    Health Maintenance Due  Topic Date Due   COVID-19 Vaccine (1) Never done   Hepatitis C Screening  Never done   TETANUS/TDAP  Never done   COLONOSCOPY (Pts 45-70yrs Insurance coverage will need to be confirmed)  Never done   MAMMOGRAM  05/16/2020   Zoster Vaccines- Shingrix (1 of 2) Never done   INFLUENZA VACCINE  Never done   PAP SMEAR-Modifier  02/02/2021    No past medical history on file.  Past Surgical History:  Procedure Laterality Date   EYE SURGERY     For astigmatism     Family History  Problem Relation Age of Onset   Hypertension Father    Cancer Father     Hypertension Mother     Social History   Socioeconomic History   Marital status: Single    Spouse name: Not on file   Number of children: Not on file   Years of education: Not on file   Highest education level: Not on file  Occupational History   Not on file  Tobacco Use   Smoking status: Never   Smokeless tobacco: Never  Vaping Use   Vaping Use: Never used  Substance and Sexual Activity   Alcohol use: Never    Comment: rare   Drug use: Never   Sexual activity: Yes    Birth control/protection: I.U.D.  Other Topics Concern   Not on file  Social History Narrative   Not on file   Social Determinants of Health   Financial Resource Strain: Not on file  Food Insecurity: Not on file  Transportation Needs: Not on file  Physical Activity: Not on file  Stress: Not on file  Social Connections: Not on file  Intimate Partner Violence: Not on file    Outpatient Medications Prior to Visit  Medication Sig Dispense Refill   Levonorgestrel (LILETTA, 52 MG, IU) by Intrauterine route.     albuterol (VENTOLIN HFA) 108 (90 Base) MCG/ACT inhaler Inhale 2 puffs into the lungs every 6 (six) hours as needed. (Patient not taking:  Reported on 02/05/2021) 18 g 0   benzonatate (TESSALON) 100 MG capsule Take 100 mg by mouth 3 (three) times daily. (Patient not taking: Reported on 02/05/2021)     Diclofenac Sodium (PENNSAID) 2 % SOLN Place 1 application onto the skin 2 (two) times daily. (Patient not taking: Reported on 02/05/2021) 112 g 3   meclizine (ANTIVERT) 12.5 MG tablet Take 1 tablet (12.5 mg total) by mouth 3 (three) times daily as needed for dizziness. (Patient not taking: Reported on 02/05/2021) 30 tablet 0   meloxicam (MOBIC) 15 MG tablet Take 1 tablet (15 mg total) by mouth daily. (Patient not taking: Reported on 02/05/2021) 14 tablet 0   predniSONE (DELTASONE) 5 MG tablet Take 6 pills for first day, 5 pills second day, 4 pills third day, 3 pills fourth day, 2 pills the fifth day, and 1  pill sixth day. (Patient not taking: Reported on 02/05/2021) 21 tablet 0   predniSONE (STERAPRED UNI-PAK 21 TAB) 10 MG (21) TBPK tablet Taper over 6 days. (Patient not taking: Reported on 02/05/2021) 21 tablet 0   No facility-administered medications prior to visit.    No Known Allergies  Review of Systems  Constitutional:  Positive for fever.  HENT:  Positive for sore throat.   Respiratory:  Positive for cough.   Musculoskeletal:        (+)body aches      Objective:    Physical Exam Constitutional:      General: She is not in acute distress.    Appearance: Normal appearance. She is ill-appearing.  HENT:     Head: Normocephalic and atraumatic.     Right Ear: External ear normal.     Left Ear: External ear normal.     Nose: Nose normal.     Mouth/Throat:     Mouth: Mucous membranes are moist.     Pharynx: Oropharynx is clear. Posterior oropharyngeal erythema present. No oropharyngeal exudate.     Tonsils: 2+ on the right. 2+ on the left.  Eyes:     Extraocular Movements: Extraocular movements intact.     Pupils: Pupils are equal, round, and reactive to light.  Cardiovascular:     Rate and Rhythm: Normal rate and regular rhythm.     Heart sounds: Normal heart sounds. No murmur heard.   No gallop.  Pulmonary:     Effort: Pulmonary effort is normal. No respiratory distress.     Breath sounds: Normal breath sounds. No wheezing or rales.  Skin:    General: Skin is warm and dry.  Neurological:     Mental Status: She is alert and oriented to person, place, and time.  Psychiatric:        Behavior: Behavior normal.    BP 132/69 (BP Location: Right Arm, Patient Position: Sitting, Cuff Size: Large)   Pulse (!) 106   Temp (!) 101.7 F (38.7 C) (Oral)   Resp 16   Ht 5\' 5"  (1.651 m)   Wt 199 lb (90.3 kg)   SpO2 96%   BMI 33.12 kg/m  Wt Readings from Last 3 Encounters:  02/09/21 199 lb (90.3 kg)  02/05/21 201 lb (91.2 kg)  01/13/21 199 lb (90.3 kg)       Assessment &  Plan:   Problem List Items Addressed This Visit       Unprioritized   Influenza - Primary    Hx most consistent with influenza infection. Unfortunately, we are out of rapid flu tests. She is outside of the window  for Tamiflu treatment anyway, so it would not change management.  We discussed treatment plan as follows:  For sore throat/body aches- you may use ibuprofen 400mg  every 6 hours or tylenol 650mg  every 6 hours. You may also use the viscous lidocaine every 6 hrs as needed for sore throat. For cough, you may use tessalon every 8 hrs as needed. Call if new/worsening symptoms or if symptoms are not improved in 3-4 days.         Meds ordered this encounter  Medications   lidocaine (XYLOCAINE) 2 % solution    Sig: Use as directed 15 mLs in the mouth or throat every 6 (six) hours as needed for mouth pain.    Dispense:  200 mL    Refill:  0    Order Specific Question:   Supervising Provider    Answer:   A [[id number]]   benzonatate (TESSALON) 100 MG capsule    Sig: Take 1 capsule (100 mg total) by mouth 2 (two) times daily as needed for cough.    Dispense:  20 capsule    Refill:  0    Order Specific Question:   Supervising Provider    Answer:   A [[id number]]    I, Danise Edge NP, personally preformed the services described in this documentation.  All medical record entries made by the scribe were at my direction and in my presence.  I have reviewed the chart and discharge instructions (if applicable) and agree that the record reflects my personal performance and is accurate and complete. 02/09/2021   I,Shehryar Baig,acting as a Sandford Craze for 02/11/2021, NP.,have documented all relevant documentation on the behalf of Neurosurgeon, NP,as directed by  Lemont Fillers, NP while in the presence of Lemont Fillers, NP.   Lemont Fillers, NP

## 2021-02-10 ENCOUNTER — Ambulatory Visit: Payer: 59

## 2021-02-15 IMAGING — DX DG SACRUM/COCCYX 2+V
3 series · 3 of 3 positions shown · non-contrast
Comparison: None.

CLINICAL DATA: Sacral pain for 2 weeks

EXAM:
SACRUM AND COCCYX - 2+ VIEW

[coccyx ap]
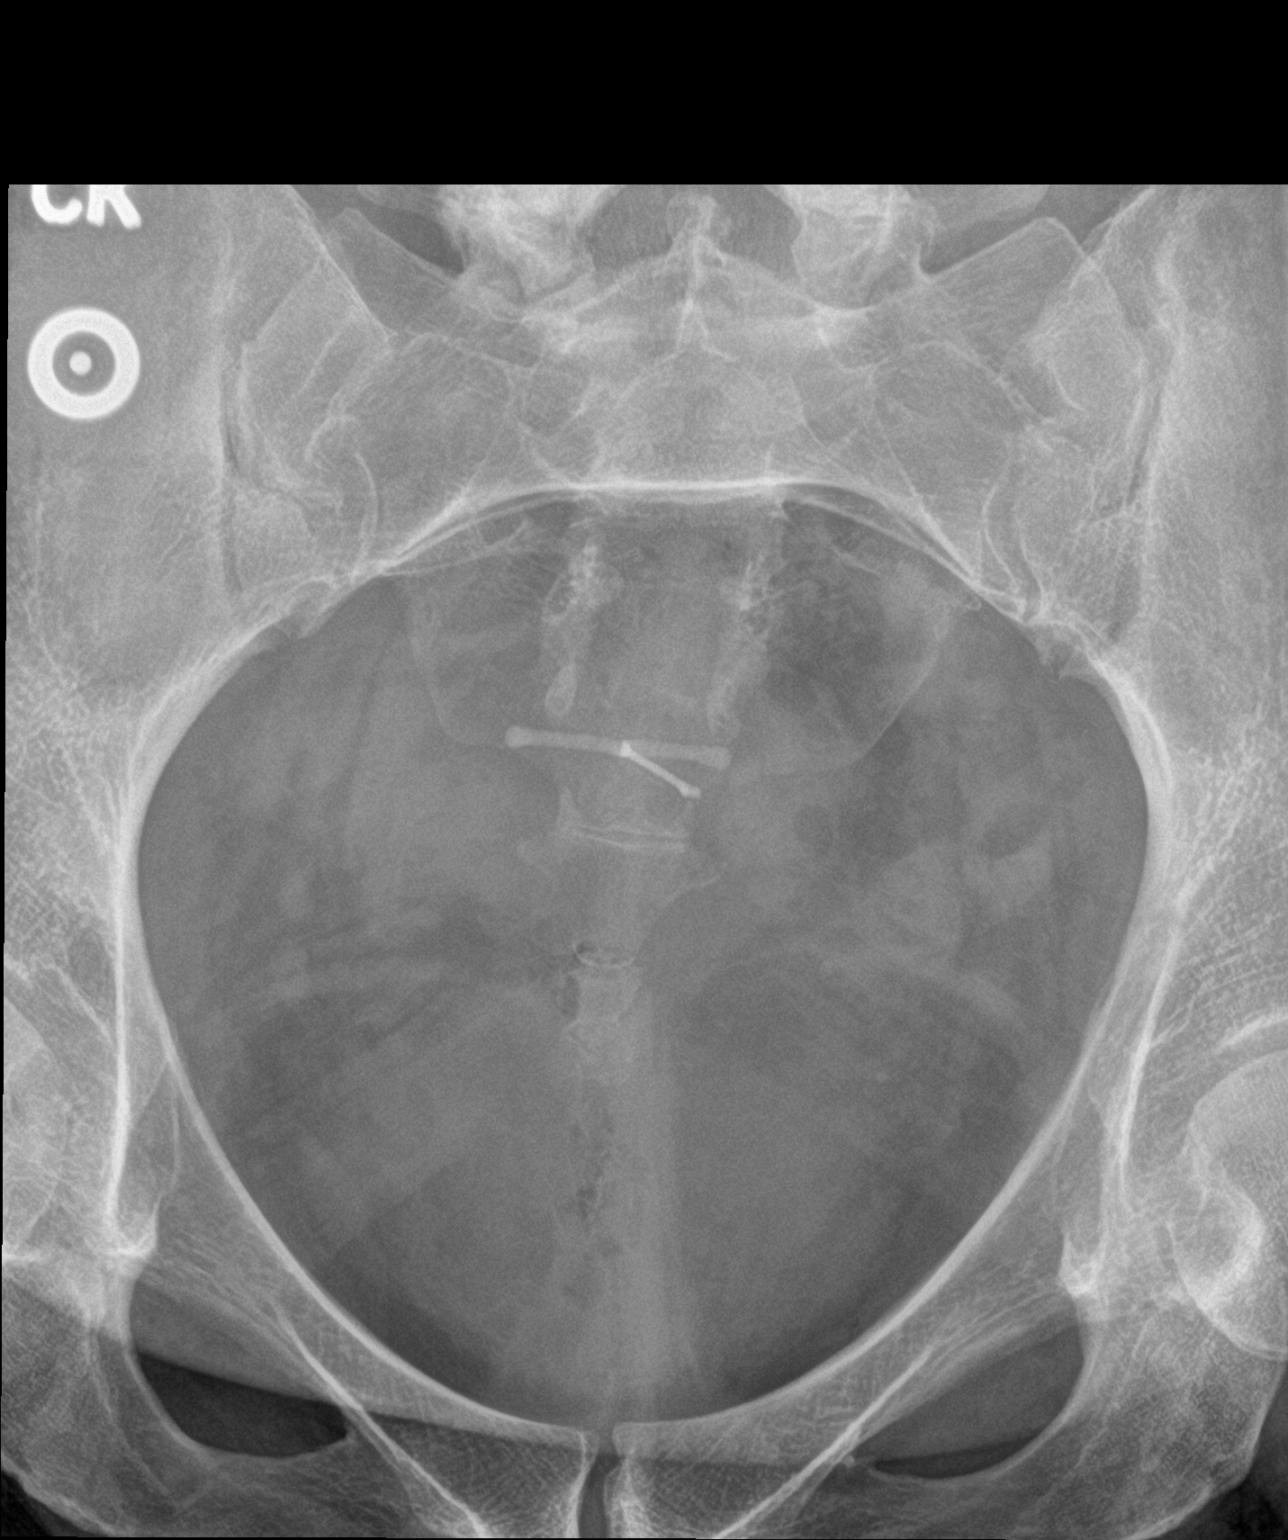

[sacrum ap]
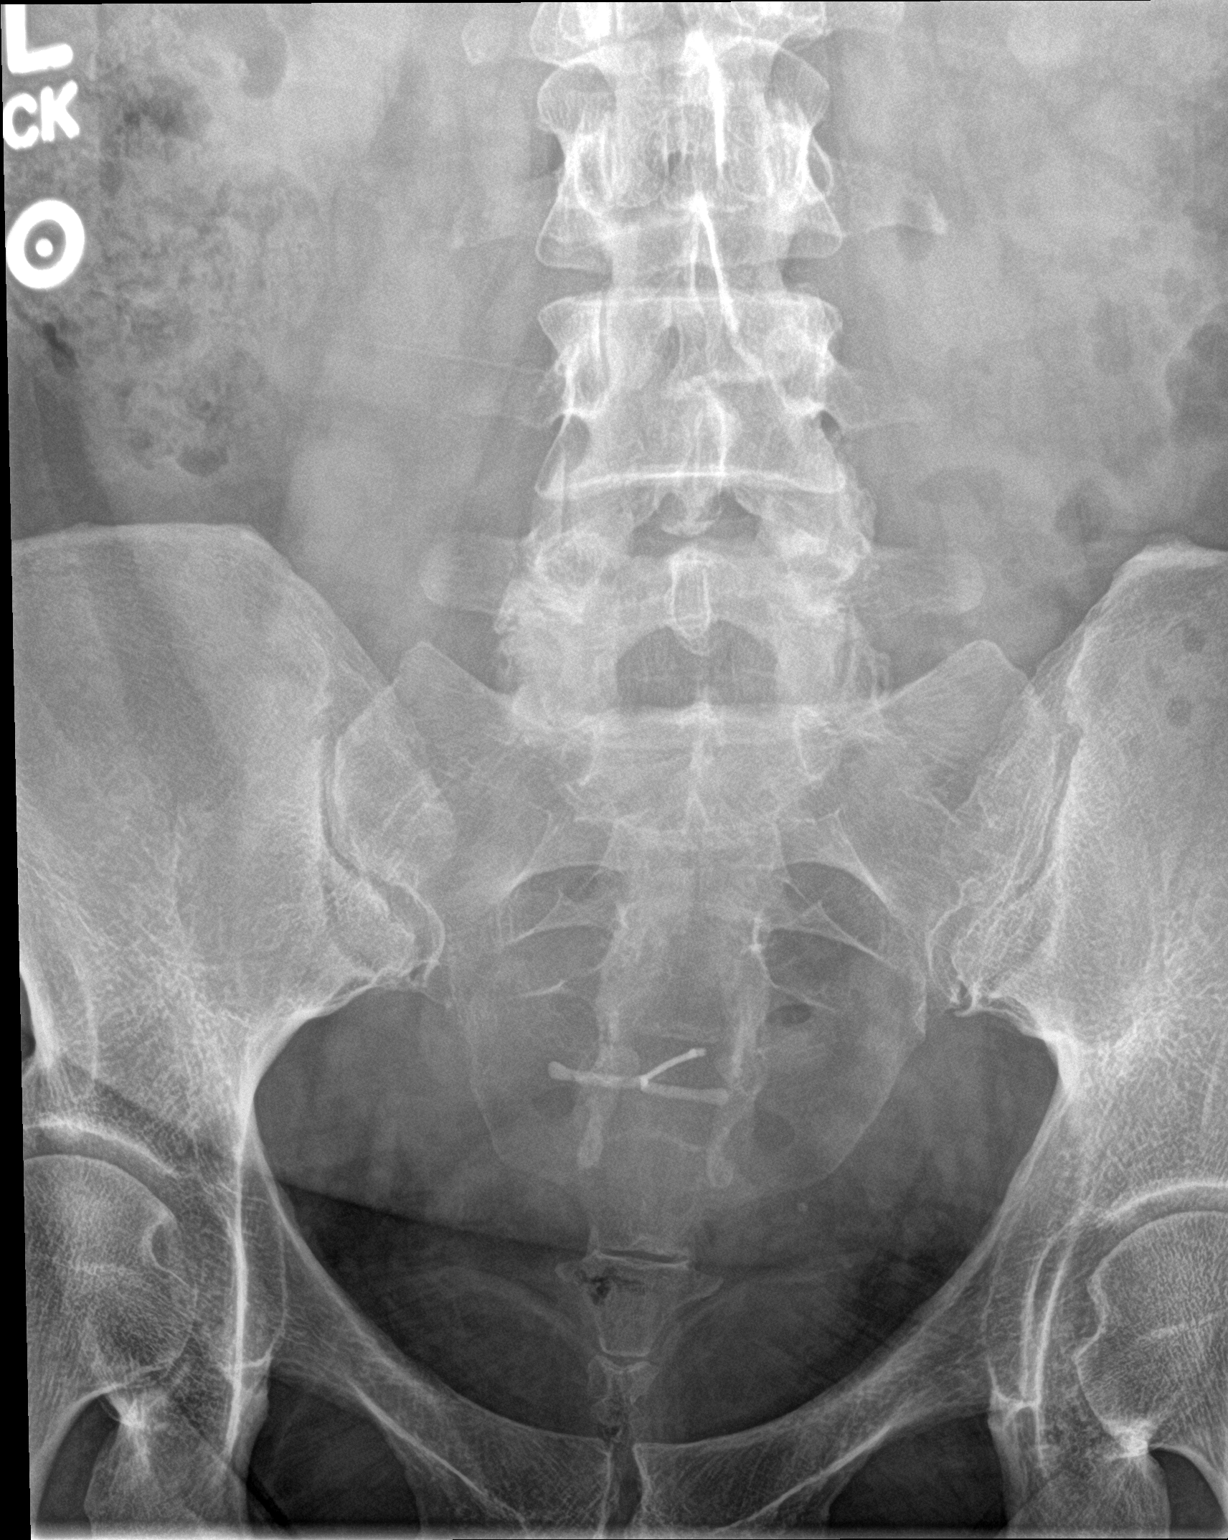

[sacrum lat]
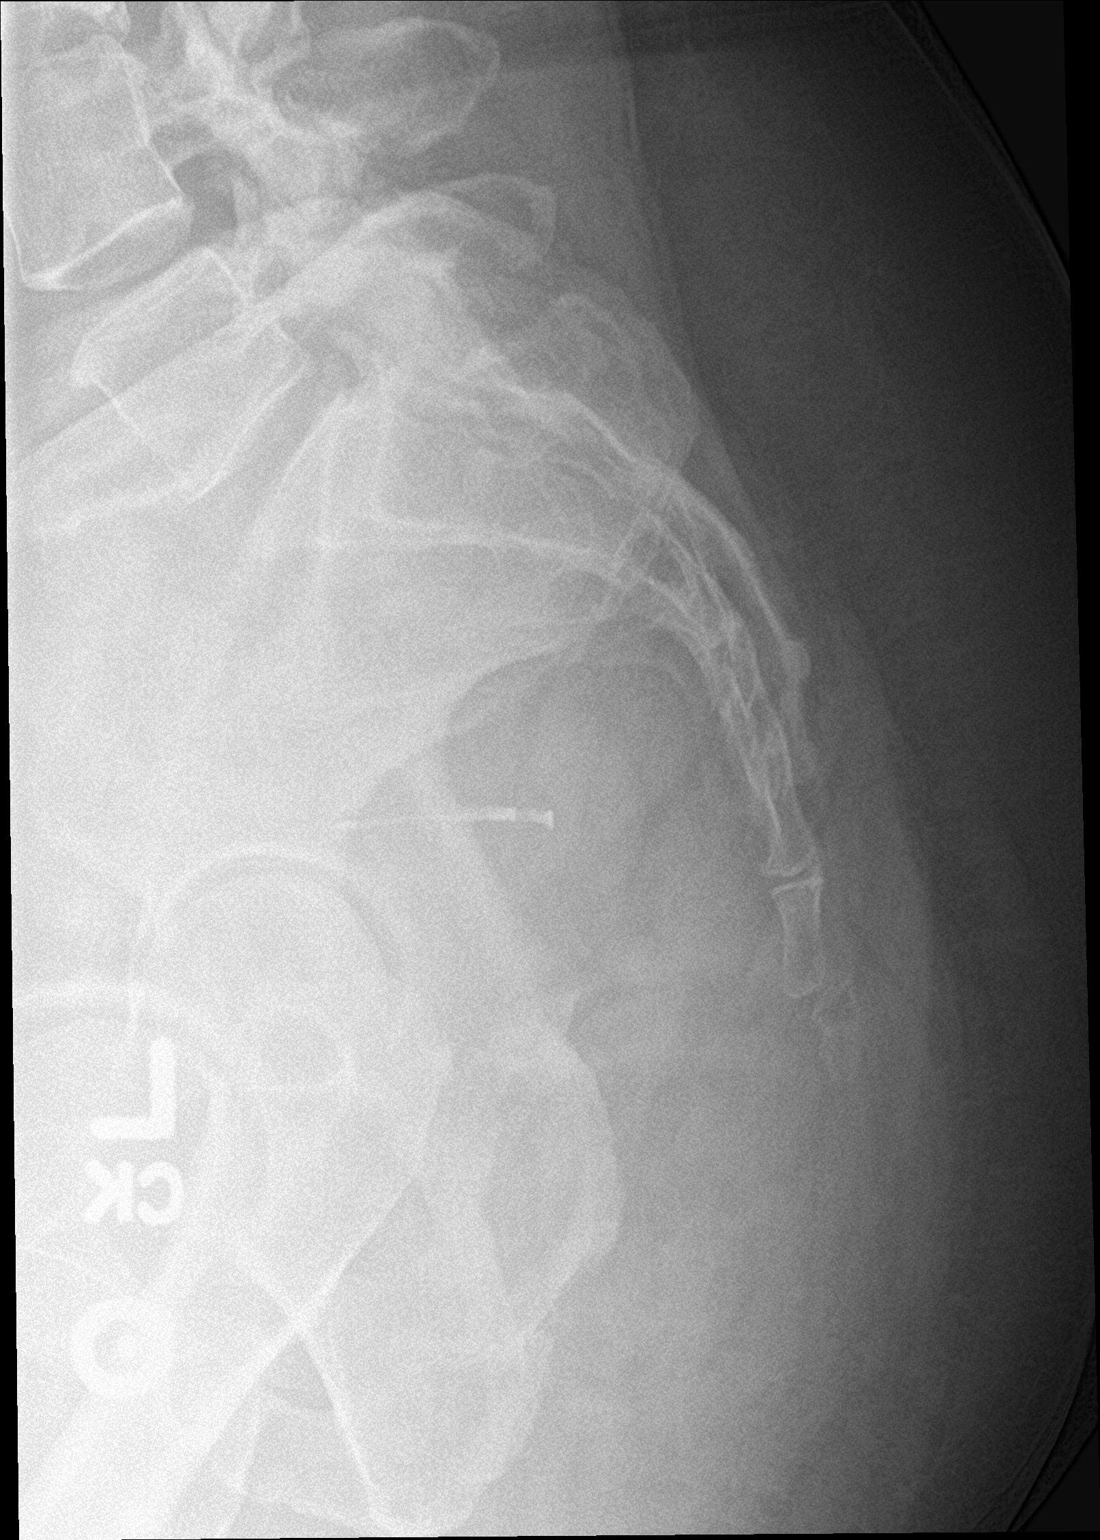

[3 of 3 positions shown; findings below may reference images not displayed]

FINDINGS: Pelvic ring is intact. IUD is noted in place. Mild degenerative
changes of the lumbar spine are seen. Sacral ala are within normal
limits. Slight posterior displacement of the distal coccygeal
segments is noted of uncertain chronicity. Correlation to point
tenderness is recommended.
IMPRESSION: Slight posterior displacement of the coccygeal segment of uncertain
chronicity.

## 2021-02-19 LAB — CYTOLOGY - PAP
Comment: NEGATIVE
Diagnosis: NEGATIVE
High risk HPV: NEGATIVE

## 2021-04-30 ENCOUNTER — Other Ambulatory Visit: Payer: Self-pay

## 2021-04-30 ENCOUNTER — Ambulatory Visit: Payer: 59 | Admitting: Family Medicine

## 2021-04-30 ENCOUNTER — Encounter: Payer: Self-pay | Admitting: Family Medicine

## 2021-04-30 VITALS — BP 99/50 | HR 74 | Wt 197.0 lb

## 2021-04-30 DIAGNOSIS — N920 Excessive and frequent menstruation with regular cycle: Secondary | ICD-10-CM

## 2021-04-30 DIAGNOSIS — N946 Dysmenorrhea, unspecified: Secondary | ICD-10-CM | POA: Diagnosis not present

## 2021-04-30 MED ORDER — DICLOFENAC SODIUM 75 MG PO TBEC: 75.0000 mg | DELAYED_RELEASE_TABLET | Freq: Two times a day (BID) | ORAL | 3 refills | Status: AC | PRN

## 2021-04-30 NOTE — Progress Notes (Addendum)
° °  Subjective:    Patient ID: Virginia Allen, female    DOB: [birth year], 51 y.o.   MRN: [id number]  HPI  Patient seen for menorrhagia and dysmenorrhea.  Since removing the IUD, the patient has had 2-3 cycles that have been heavy and with a lot more cramping and pain.  Periods still last 5 or so days, but saturates pads.  Menstrual cramping is rated as severe.  She also notes increased hair loss and weight gain with difficulty losing it.   Review of Systems     Objective:  BP (!) 99/50    Pulse 74    Wt 197 lb (89.4 kg)    LMP 04/13/2021 (Approximate)    BMI 32.78 kg/m  Wt Readings from Last 3 Encounters:  04/30/21 197 lb (89.4 kg)  02/09/21 199 lb (90.3 kg)  02/05/21 201 lb (91.2 kg)     Physical Exam Vitals reviewed.  Constitutional:      Appearance: Normal appearance.  Cardiovascular:     Rate and Rhythm: Normal rate and regular rhythm.  Pulmonary:     Effort: Pulmonary effort is normal.  Abdominal:     General: Abdomen is flat. There is no distension.     Palpations: Abdomen is soft.     Tenderness: There is no abdominal tenderness.  Skin:    Capillary Refill: Capillary refill takes less than 2 seconds.  Neurological:     General: No focal deficit present.     Mental Status: She is alert.  Psychiatric:        Mood and Affect: Mood normal.        Behavior: Behavior normal.        Thought Content: Thought content normal.        Judgment: Judgment normal.       Assessment & Plan:  1. Dysmenorrhea Will check [place] and hormone levels.  TSH may contribute to hair loss and weight gain. - Thyroid Panel With TSH - Follicle stimulating hormone - LH - US PELVIC COMPLETE WITH TRANSVAGINAL; Future  2. Menorrhagia with regular cycle Discussed treatment options.  Patient would like to just do pain medications.  Will give Voltaren twice daily as needed.  If patient not satisfied with this, will could trial Lysteda which may help with both dysmenorrhea and menorrhagia.   Also recommended taking Voltaren twice a day during the bleeding as this may help to reduce the amount that she is bleeding. - Thyroid Panel With TSH - Follicle stimulating hormone - LH - US PELVIC COMPLETE WITH TRANSVAGINAL; Future

## 2021-04-30 NOTE — Progress Notes (Signed)
Patient following up after IUD removal - checking on dysmenorrhea. Patient states that this last period she had lots of cramping. Patient states her pain and cramping was 3-4 days in length. Armandina Stammer RN

## 2021-05-01 LAB — THYROID PANEL WITH TSH
Free Thyroxine Index: 1.8 (ref 1.2–4.9)
T3 Uptake Ratio: 28 % (ref 24–39)
T4, Total: 6.6 ug/dL (ref 4.5–12.0)
TSH: 2.22 u[IU]/mL (ref 0.450–4.500)

## 2021-05-01 LAB — LUTEINIZING HORMONE: LH: 37.6 m[IU]/mL

## 2021-05-01 LAB — FOLLICLE STIMULATING HORMONE: FSH: 13.7 m[IU]/mL

## 2021-05-02 ENCOUNTER — Ambulatory Visit (HOSPITAL_BASED_OUTPATIENT_CLINIC_OR_DEPARTMENT_OTHER): Payer: 59

## 2021-05-06 ENCOUNTER — Ambulatory Visit (HOSPITAL_BASED_OUTPATIENT_CLINIC_OR_DEPARTMENT_OTHER): Admission: RE | Admit: 2021-05-06 | Payer: 59 | Source: Ambulatory Visit

## 2021-12-02 ENCOUNTER — Encounter: Payer: Self-pay | Admitting: General Practice

## 2022-07-05 ENCOUNTER — Encounter: Payer: Self-pay | Admitting: *Deleted

## 2022-11-12 NOTE — Progress Notes (Signed)
This encounter was created in error - please disregard.
